# Patient Record
Sex: Female | Born: 1960 | Race: White | Hispanic: No | Marital: Married | Smoking: Former smoker
Health system: Southern US, Community
[De-identification: ages and names within clinical notes are randomized; demographics above are authoritative.]

## PROBLEM LIST (undated history)

## (undated) HISTORY — PX: KNEE SURGERY: SHX244

## (undated) HISTORY — PX: TUBAL LIGATION: SHX77

## (undated) HISTORY — PX: FOOT SURGERY: SHX648

---

## 1998-02-15 ENCOUNTER — Emergency Department (HOSPITAL_COMMUNITY): Admission: EM | Admit: 1998-02-15 | Discharge: 1998-02-15 | Payer: Self-pay | Admitting: Emergency Medicine

## 1998-07-26 ENCOUNTER — Encounter: Payer: Self-pay | Admitting: Emergency Medicine

## 1998-07-26 ENCOUNTER — Emergency Department (HOSPITAL_COMMUNITY): Admission: EM | Admit: 1998-07-26 | Discharge: 1998-07-26 | Payer: Self-pay | Admitting: Emergency Medicine

## 1998-10-10 ENCOUNTER — Encounter: Payer: Self-pay | Admitting: Orthopedic Surgery

## 1998-10-10 ENCOUNTER — Ambulatory Visit (HOSPITAL_COMMUNITY): Admission: RE | Admit: 1998-10-10 | Discharge: 1998-10-10 | Payer: Self-pay | Admitting: Orthopedic Surgery

## 1998-12-07 ENCOUNTER — Other Ambulatory Visit: Admission: RE | Admit: 1998-12-07 | Discharge: 1998-12-07 | Payer: Self-pay | Admitting: Obstetrics

## 1999-01-05 ENCOUNTER — Ambulatory Visit (HOSPITAL_COMMUNITY): Admission: RE | Admit: 1999-01-05 | Discharge: 1999-01-05 | Payer: Self-pay | Admitting: Obstetrics

## 1999-01-05 ENCOUNTER — Encounter: Payer: Self-pay | Admitting: Obstetrics

## 1999-03-24 ENCOUNTER — Ambulatory Visit (HOSPITAL_COMMUNITY): Admission: RE | Admit: 1999-03-24 | Discharge: 1999-03-24 | Payer: Self-pay | Admitting: *Deleted

## 2000-01-04 ENCOUNTER — Other Ambulatory Visit: Admission: RE | Admit: 2000-01-04 | Discharge: 2000-01-04 | Payer: Self-pay | Admitting: Obstetrics

## 2000-02-08 ENCOUNTER — Encounter: Payer: Self-pay | Admitting: Obstetrics

## 2000-02-08 ENCOUNTER — Ambulatory Visit (HOSPITAL_COMMUNITY): Admission: RE | Admit: 2000-02-08 | Discharge: 2000-02-08 | Payer: Self-pay | Admitting: Obstetrics

## 2001-03-12 ENCOUNTER — Encounter: Payer: Self-pay | Admitting: Obstetrics

## 2001-03-12 ENCOUNTER — Ambulatory Visit (HOSPITAL_COMMUNITY): Admission: RE | Admit: 2001-03-12 | Discharge: 2001-03-12 | Payer: Self-pay | Admitting: Obstetrics

## 2001-06-11 ENCOUNTER — Emergency Department (HOSPITAL_COMMUNITY): Admission: EM | Admit: 2001-06-11 | Discharge: 2001-06-11 | Payer: Self-pay | Admitting: Emergency Medicine

## 2001-06-13 ENCOUNTER — Emergency Department (HOSPITAL_COMMUNITY): Admission: EM | Admit: 2001-06-13 | Discharge: 2001-06-13 | Payer: Self-pay | Admitting: Emergency Medicine

## 2002-03-16 ENCOUNTER — Ambulatory Visit (HOSPITAL_COMMUNITY): Admission: RE | Admit: 2002-03-16 | Discharge: 2002-03-16 | Payer: Self-pay | Admitting: Obstetrics

## 2002-03-16 ENCOUNTER — Encounter: Payer: Self-pay | Admitting: Obstetrics

## 2003-05-24 ENCOUNTER — Ambulatory Visit (HOSPITAL_COMMUNITY): Admission: RE | Admit: 2003-05-24 | Discharge: 2003-05-24 | Payer: Self-pay | Admitting: Obstetrics

## 2004-06-14 ENCOUNTER — Ambulatory Visit (HOSPITAL_COMMUNITY): Admission: RE | Admit: 2004-06-14 | Discharge: 2004-06-14 | Payer: Self-pay | Admitting: Obstetrics

## 2004-09-09 ENCOUNTER — Emergency Department (HOSPITAL_COMMUNITY): Admission: EM | Admit: 2004-09-09 | Discharge: 2004-09-09 | Payer: Self-pay | Admitting: Emergency Medicine

## 2005-06-27 ENCOUNTER — Encounter: Admission: RE | Admit: 2005-06-27 | Discharge: 2005-06-27 | Payer: Self-pay | Admitting: Obstetrics

## 2005-08-02 ENCOUNTER — Encounter: Admission: RE | Admit: 2005-08-02 | Discharge: 2005-08-02 | Payer: Self-pay | Admitting: Obstetrics

## 2010-11-19 ENCOUNTER — Encounter: Payer: Self-pay | Admitting: Obstetrics

## 2011-04-10 ENCOUNTER — Emergency Department (HOSPITAL_COMMUNITY)
Admission: EM | Admit: 2011-04-10 | Discharge: 2011-04-10 | Disposition: A | Discharge status: Home / Self Care | Payer: Self-pay | Attending: Emergency Medicine | Admitting: Emergency Medicine

## 2011-04-10 DIAGNOSIS — F17200 Nicotine dependence, unspecified, uncomplicated: Secondary | ICD-10-CM | POA: Insufficient documentation

## 2011-04-10 DIAGNOSIS — IMO0001 Reserved for inherently not codable concepts without codable children: Secondary | ICD-10-CM | POA: Insufficient documentation

## 2011-04-10 DIAGNOSIS — K089 Disorder of teeth and supporting structures, unspecified: Secondary | ICD-10-CM | POA: Insufficient documentation

## 2011-04-10 DIAGNOSIS — K047 Periapical abscess without sinus: Secondary | ICD-10-CM | POA: Insufficient documentation

## 2013-01-08 ENCOUNTER — Other Ambulatory Visit (HOSPITAL_COMMUNITY)
Admission: RE | Admit: 2013-01-08 | Discharge: 2013-01-08 | Disposition: A | Discharge status: Home / Self Care | Payer: BC Managed Care – PPO | Source: Ambulatory Visit | Attending: Family Medicine | Admitting: Family Medicine

## 2013-01-08 ENCOUNTER — Other Ambulatory Visit: Payer: Self-pay | Admitting: Family Medicine

## 2013-01-08 DIAGNOSIS — Z124 Encounter for screening for malignant neoplasm of cervix: Secondary | ICD-10-CM | POA: Insufficient documentation

## 2013-01-08 DIAGNOSIS — Z1151 Encounter for screening for human papillomavirus (HPV): Secondary | ICD-10-CM | POA: Insufficient documentation

## 2013-04-16 ENCOUNTER — Other Ambulatory Visit: Payer: Self-pay

## 2013-04-16 DIAGNOSIS — Z1231 Encounter for screening mammogram for malignant neoplasm of breast: Secondary | ICD-10-CM

## 2013-04-22 ENCOUNTER — Ambulatory Visit
Admission: RE | Admit: 2013-04-22 | Discharge: 2013-04-22 | Disposition: A | Discharge status: Home / Self Care | Payer: BC Managed Care – PPO | Source: Ambulatory Visit

## 2013-04-22 DIAGNOSIS — Z1231 Encounter for screening mammogram for malignant neoplasm of breast: Secondary | ICD-10-CM

## 2014-03-16 ENCOUNTER — Other Ambulatory Visit: Payer: Self-pay

## 2014-03-16 DIAGNOSIS — Z1231 Encounter for screening mammogram for malignant neoplasm of breast: Secondary | ICD-10-CM

## 2014-03-29 ENCOUNTER — Other Ambulatory Visit: Payer: Self-pay | Admitting: Gastroenterology

## 2014-04-26 ENCOUNTER — Ambulatory Visit
Admission: RE | Admit: 2014-04-26 | Discharge: 2014-04-26 | Disposition: A | Discharge status: Home / Self Care | Payer: PRIVATE HEALTH INSURANCE | Source: Ambulatory Visit

## 2014-04-26 DIAGNOSIS — Z1231 Encounter for screening mammogram for malignant neoplasm of breast: Secondary | ICD-10-CM

## 2017-08-27 ENCOUNTER — Encounter: Payer: Self-pay | Admitting: Emergency Medicine

## 2017-08-27 ENCOUNTER — Emergency Department: Payer: PRIVATE HEALTH INSURANCE

## 2017-08-27 ENCOUNTER — Emergency Department
Admission: EM | Admit: 2017-08-27 | Discharge: 2017-08-27 | Disposition: A | Discharge status: Home / Self Care | Payer: PRIVATE HEALTH INSURANCE | Attending: Emergency Medicine | Admitting: Emergency Medicine

## 2017-08-27 DIAGNOSIS — R079 Chest pain, unspecified: Secondary | ICD-10-CM | POA: Insufficient documentation

## 2017-08-27 DIAGNOSIS — F17210 Nicotine dependence, cigarettes, uncomplicated: Secondary | ICD-10-CM | POA: Insufficient documentation

## 2017-08-27 LAB — CBC
HEMATOCRIT: 41.8 % (ref 35.0–47.0)
HEMOGLOBIN: 14.3 g/dL (ref 12.0–16.0)
MCH: 30.9 pg (ref 26.0–34.0)
MCHC: 34.1 g/dL (ref 32.0–36.0)
MCV: 90.5 fL (ref 80.0–100.0)
Platelets: 282 10*3/uL (ref 150–440)
RBC: 4.61 MIL/uL (ref 3.80–5.20)
RDW: 13.3 % (ref 11.5–14.5)
WBC: 8.4 10*3/uL (ref 3.6–11.0)

## 2017-08-27 LAB — BASIC METABOLIC PANEL
ANION GAP: 6 (ref 5–15)
BUN: 16 mg/dL (ref 6–20)
CO2: 28 mmol/L (ref 22–32)
Calcium: 8.9 mg/dL (ref 8.9–10.3)
Chloride: 105 mmol/L (ref 101–111)
Creatinine, Ser: 0.75 mg/dL (ref 0.44–1.00)
Glucose, Bld: 107 mg/dL — ABNORMAL HIGH (ref 65–99)
POTASSIUM: 4.6 mmol/L (ref 3.5–5.1)
SODIUM: 139 mmol/L (ref 135–145)

## 2017-08-27 LAB — TROPONIN I: Troponin I: <0.03 ng/mL (ref <0.03)

## 2017-08-27 MED ORDER — ASPIRIN EC 81 MG PO TBEC: 81.0000 mg | DELAYED_RELEASE_TABLET | Freq: Every day | ORAL | 0 refills | Status: AC

## 2017-08-27 MED ORDER — KETOROLAC TROMETHAMINE 30 MG/ML IJ SOLN
30.0000 mg | Freq: Once | INTRAMUSCULAR | Status: AC
  Administered 2017-08-27: 30 mg via INTRAVENOUS
  Filled 2017-08-27: qty 1

## 2017-08-27 MED ORDER — SODIUM CHLORIDE 0.9 % IV BOLUS (SEPSIS)
500.0000 mL | Freq: Once | INTRAVENOUS | Status: AC
  Administered 2017-08-27: 500 mL via INTRAVENOUS

## 2017-08-27 MED ORDER — BACITRACIN ZINC 500 UNIT/GM EX OINT
TOPICAL_OINTMENT | CUTANEOUS | Status: AC
  Filled 2017-08-27: qty 0.9

## 2017-08-27 NOTE — ED Notes (Signed)
Patient to Room 19, [facility] RN aware.

## 2017-08-27 NOTE — ED Provider Notes (Signed)
[facility] Emergency Department Provider Note   ____________________________________________   First MD Initiated Contact with Patient 08/27/17 1143     (approximate)  I have reviewed the triage vital signs and the nursing notes.   HISTORY  Chief Complaint Chest Pain    HPI Lindsay Mcconnell is a 56 y.o. female with a history of fibromyalgia as well as smoking who is presenting to the emergency department today with left-sided chest pain.  She says that she has had a pressure type pain which was worse on this past Friday as well as Sunday.  She says that she was at a baby shower with her multiple sisters which made her very stressed.  She thinks that this may have contributed to her chest pain.  However, the chest pain has continued as a constant pressure over the past 2 days.  It is associated with weakness.  There is radiation over the left side of the chest to the left thoracic back.  There is no nausea, vomiting or diaphoresis.  The patient has a family history of CAD but has never been diagnosed with cardiac disease herself.  She says that she is quitting smoking and is down to about 5-10 cigarettes/day.  Says that she also does heavy lifting on her job and pain worsens with movement.    History reviewed. No pertinent past medical history.  There are no active problems to display for this patient.   Past Surgical History:  Procedure Laterality Date  . FOOT SURGERY    . KNEE SURGERY    . TUBAL LIGATION      Prior to Admission medications   Not on File    Allergies Erythromycin; Sulfa antibiotics; and Penicillins  History reviewed. No pertinent family history.  Social History Social History  Substance Use Topics  . Smoking status: Current Every Day Smoker    Packs/day: 0.50    Types: Cigarettes  . Smokeless tobacco: Never Used  . Alcohol use 30.0 oz/week    50 Cans of beer per week    Review of Systems  Constitutional: No  fever/chills Eyes: No visual changes. ENT: No sore throat. Cardiovascular: as above Respiratory: Denies shortness of breath. Gastrointestinal: No abdominal pain.  No nausea, no vomiting.  No diarrhea.  No constipation. Genitourinary: Negative for dysuria. Musculoskeletal: Negative for back pain. Skin: Negative for rash. Neurological: Negative for headaches, focal weakness or numbness.   ____________________________________________   PHYSICAL EXAM:  VITAL SIGNS: ED Triage Vitals  Enc Vitals Group     BP 08/27/17 1128 120/73     Pulse Rate 08/27/17 1128 (!) 54     Resp 08/27/17 1128 18     Temp 08/27/17 1128 98.9 F (37.2 C)     Temp Source 08/27/17 1128 Oral     SpO2 08/27/17 1128 98 %     Weight 08/27/17 1128 165 lb (74.8 kg)     Height 08/27/17 1128 5\' 7"  (1.702 m)     Head Circumference --      Peak Flow --      Pain Score 08/27/17 1127 6     Pain Loc --      Pain Edu? --      Excl. in [place]? --     Constitutional: Alert and oriented. Well appearing and in no acute distress. Eyes: Conjunctivae are normal.  Head: Atraumatic. Nose: No congestion/rhinnorhea. Mouth/Throat: Mucous membranes are moist.  Neck: No stridor.   Cardiovascular: Normal rate, regular rhythm. Grossly  normal heart sounds.  Good peripheral circulation with equal bilateral radial as well as dorsalis pedis pulses.  Chest pain is reproducible to palpation over the left outer quadrant of the pectoralis major muscle. Respiratory: Normal respiratory effort.  No retractions. Lungs CTAB. Gastrointestinal: Soft and nontender. No distention.  Musculoskeletal: No lower extremity tenderness nor edema.  No joint effusions. Neurologic:  Normal speech and language. No gross focal neurologic deficits are appreciated. Skin:  Skin is warm, dry and intact. No rash noted. Psychiatric: Mood and affect are normal. Speech and behavior are normal.  ____________________________________________   LABS (all labs ordered are  listed, but only abnormal results are displayed)  Labs Reviewed  BASIC METABOLIC PANEL - Abnormal; Notable for the following:       Result Value   Glucose, Bld 107 (*)    All other components within normal limits  CBC  TROPONIN I   ____________________________________________  EKG  ED ECG REPORT I, Doran Stabler, the attending physician, personally viewed and interpreted this ECG.   Date: 08/27/2017  EKG Time: 1126  Rate: 51  Rhythm: sinus bradycardia  Axis: normal  Intervals:none  ST&T Change: No ST segment elevation or depression.  No abnormal T wave inversion.  ____________________________________________  RADIOLOGY  No acute finding ____________________________________________   PROCEDURES  Procedure(s) performed:   Procedures  Critical Care performed:   ____________________________________________   INITIAL IMPRESSION / ASSESSMENT AND PLAN / ED COURSE  Pertinent labs & imaging results that were available during my care of the patient were reviewed by me and considered in my medical decision making (see chart for details).  Differential diagnosis includes, but is not limited to, ACS, aortic dissection, pulmonary embolism, cardiac tamponade, pneumothorax, pneumonia, pericarditis/myocarditis, GI-related causes including esophagitis/gastritis, and musculoskeletal chest wall pain.    As part of my medical decision making, I reviewed the following data within the [facility] to review old ED visits because not available in epic.   Heart score of 2.   ----------------------------------------- 1:17 PM on 08/27/2017 -----------------------------------------  Patient's chest pain is greatly reduced after Toradol.  Very reassuring lab work as well as chest x-ray.  Patient to be referred to cardiology for outpatient follow-up within 72 hours.  I discussed the plan with the patient and she is understanding and willing to comply but will  be discharged at this time.  Patient knows to return for any worsening or concerning symptoms.  Likely chest wall pain.  ____________________________________________   FINAL CLINICAL IMPRESSION(S) / ED DIAGNOSES  Chest pain.    NEW MEDICATIONS STARTED DURING THIS VISIT:  New Prescriptions   No medications on file     Note:  This document was prepared using Dragon voice recognition software and may include unintentional dictation errors.    Orbie Pyo, MD 08/27/17 1318

## 2017-08-27 NOTE — ED Notes (Signed)
Patient's fiance brought to bedside.

## 2017-08-27 NOTE — ED Triage Notes (Signed)
Pt presents with chest pain since last Friday intermittently. She states that it was really bad on the weekend, and she went to her doctor today, who sent her here. Pt concerned that it may have been anxiety related, as there were some family gatherings over the weekend. Pain remains, but has changed, from sharp to pressure. Pt alert & oriented with NAD noted,.

## 2017-10-30 ENCOUNTER — Ambulatory Visit: Payer: Self-pay

## 2017-12-04 ENCOUNTER — Ambulatory Visit
Admission: RE | Admit: 2017-12-04 | Discharge: 2017-12-04 | Disposition: A | Discharge status: Home / Self Care | Payer: Self-pay | Source: Ambulatory Visit | Attending: Oncology | Admitting: Oncology

## 2017-12-04 ENCOUNTER — Ambulatory Visit: Payer: Self-pay | Attending: Oncology | Admitting: *Deleted

## 2017-12-04 VITALS — BP 117/78 | HR 59 | Temp 98.2°F | Ht 67.0 in | Wt 183.0 lb

## 2017-12-04 DIAGNOSIS — Z0000 Encounter for general adult medical examination without abnormal findings: Secondary | ICD-10-CM

## 2017-12-04 NOTE — Patient Instructions (Signed)
HPV Test The human papillomavirus (HPV) test is used to look for high-risk types of HPV infection. HPV is a group of about 100 viruses. Many of these viruses cause growths on, in, or around the genitals. Most HPV viruses cause infections that usually go away without treatment. However, HPV types 6, 11, 16, and 18 are considered high-risk types of HPV that can increase your risk of cancer of the cervix or anus if the infection is left untreated. An HPV test identifies the DNA (genetic) strands of the HPV infection, so it is also referred to as the HPV DNA test. Although HPV is found in both males and females, the HPV test is only used to screen for increased cancer risk in females:  With an abnormal Pap test.  After treatment of an abnormal Pap test.  Between the ages of 30 and 65.  After treatment of a high-risk HPV infection.  The HPV test may be done at the same time as a pelvic exam and Pap test in females over the age of 30. Both the HPV test and Pap test require a sample of cells from the cervix. How do I prepare for this test?  Do not douche or take a bath for 24-48 hours before the test or as directed by your health care provider.  Do not have sex for 24-48 hours before the test or as directed by your health care provider.  You may be asked to reschedule the test if you are menstruating.  You will be asked to urinate before the test. What do the results mean? It is your responsibility to obtain your test results. Ask the lab or department performing the test when and how you will get your results. Talk with your health care provider if you have any questions about your results. Your result will be negative or positive. Meaning of Negative Test Results A negative HPV test result means that no HPV was found, and it is very likely that you do not have HPV. Meaning of Positive Test Results A positive HPV test result indicates that you have HPV.  If your test result shows the presence  of any high-risk HPV strains, you may have an increased risk of developing cancer of the cervix or anus if the infection is left untreated.  If any low-risk HPV strains are found, you are not likely to have an increased risk of cancer.  Discuss your test results with your health care provider. He or she will use the results to make a diagnosis and determine a treatment plan that is right for you. Talk with your health care provider to discuss your results, treatment options, and if necessary, the need for more tests. Talk with your health care provider if you have any questions about your results. This information is not intended to replace advice given to you by your health care provider. Make sure you discuss any questions you have with your health care provider. Document Released: 11/09/2004 Document Revised: 06/20/2016 Document Reviewed: 03/02/2014 Elsevier Interactive Patient Education  2018 Elsevier Inc.   Gave patient hand-out, Women Staying Healthy, Active and Well from [facility], with education on breast health, pap smears, heart and colon health.  

## 2017-12-04 NOTE — Progress Notes (Signed)
Subjective:     Patient ID: Lindsay Mcconnell, female   DOB: 03-08-1961, 57 y.o.   MRN: [id number]  HPI   Review of Systems     Objective:   Physical Exam  Pulmonary/Chest: Right breast exhibits no inverted nipple, no mass, no nipple discharge, no skin change and no tenderness. Left breast exhibits no inverted nipple, no mass, no nipple discharge, no skin change and no tenderness. Breasts are symmetrical.  Abdominal: There is no splenomegaly or hepatomegaly.  Genitourinary: No labial fusion. There is no rash, tenderness, lesion or injury on the right labia. There is no rash, tenderness, lesion or injury on the left labia. Cervix exhibits no motion tenderness and no discharge. Right adnexum displays no mass, no tenderness and no fullness. Left adnexum displays no mass, no tenderness and no fullness. No erythema, tenderness or bleeding in the vagina. No foreign body in the vagina. No signs of injury around the vagina. No vaginal discharge found.       Assessment:     57 year old White female presents to [facility] for clinical breast exam, pap and mammogram.  Clinical breast exam unremarkable.  Taught self breast awareness.  Last pap on 01/08/13 was negative / negative.  Specimen collected for pap smear without difficulty.  Patient has been screened for eligibility.  She does not have any insurance, Medicare or Medicaid.  She also meets financial eligibility.  Hand-out given on the Affordable Care Act.    Plan:     Screening mammogram ordered.  Specimen for pap sent to the lab.  Will follow-up per [facility] protocol.

## 2017-12-06 LAB — PAP LB AND HPV HIGH-RISK
HPV, HIGH-RISK: NEGATIVE
PAP SMEAR COMMENT: 0

## 2017-12-12 ENCOUNTER — Encounter: Payer: Self-pay | Admitting: *Deleted

## 2017-12-12 NOTE — Progress Notes (Signed)
Letter mailed from the [facility] to inform patient of her normal mammogram results.  Patient is to follow-up with annual screening in one year.  Letter mailed to inform patient of her normal pap results.  Next pap due in 5 years.  [facility] to [place].

## 2020-01-12 ENCOUNTER — Ambulatory Visit: Payer: Self-pay | Attending: Internal Medicine

## 2020-01-12 ENCOUNTER — Other Ambulatory Visit: Payer: Self-pay

## 2020-01-12 DIAGNOSIS — Z23 Encounter for immunization: Secondary | ICD-10-CM

## 2020-01-12 NOTE — Progress Notes (Signed)
   Covid-19 Vaccination Clinic  Name:  Lindsay Mcconnell    MRN: [id number] DOB: [birth year]  01/12/2020  [name] was observed post Covid-19 immunization for 15 minutes without incident. She was provided with Vaccine Information Sheet and instruction to access the V-Safe system.   [name] was instructed to call 911 with any severe reactions post vaccine: Marland Kitchen Difficulty breathing  . Swelling of face and throat  . A fast heartbeat  . A bad rash all over body  . Dizziness and weakness   Immunizations Administered    Name Date Dose VIS Date Route   Pfizer COVID-19 Vaccine 01/12/2020  8:37 AM 0.3 mL 10/09/2019 Intramuscular   Manufacturer: [place]   Lot: [id number]   [place]: [id number]

## 2020-02-02 ENCOUNTER — Other Ambulatory Visit: Payer: Self-pay

## 2020-02-02 ENCOUNTER — Ambulatory Visit: Payer: Medicaid Other | Attending: Internal Medicine

## 2020-02-02 DIAGNOSIS — Z23 Encounter for immunization: Secondary | ICD-10-CM

## 2020-02-02 NOTE — Progress Notes (Signed)
   Covid-19 Vaccination Clinic  Name:  BELMA DADDARIO    MRN: [id number] DOB: March 29, 1961  02/02/2020  [name] was observed post Covid-19 immunization for 15 minutes without incident. She was provided with Vaccine Information Sheet and instruction to access the V-Safe system.   [name] was instructed to call 911 with any severe reactions post vaccine: Marland Kitchen Difficulty breathing  . Swelling of face and throat  . A fast heartbeat  . A bad rash all over body  . Dizziness and weakness   Immunizations Administered    Name Date Dose VIS Date Route   Pfizer COVID-19 Vaccine 02/02/2020  2:45 PM 0.3 mL 10/09/2019 Intramuscular   Manufacturer: [place]   Lot: [id number]   [place]: [id number]

## 2020-05-11 ENCOUNTER — Other Ambulatory Visit: Payer: Self-pay

## 2020-05-11 ENCOUNTER — Emergency Department
Admission: EM | Admit: 2020-05-11 | Discharge: 2020-05-11 | Disposition: A | Discharge status: Home / Self Care | Payer: Medicaid Other | Attending: Emergency Medicine | Admitting: Emergency Medicine

## 2020-05-11 ENCOUNTER — Emergency Department: Payer: Medicaid Other

## 2020-05-11 DIAGNOSIS — M25571 Pain in right ankle and joints of right foot: Secondary | ICD-10-CM | POA: Insufficient documentation

## 2020-05-11 DIAGNOSIS — Y9389 Activity, other specified: Secondary | ICD-10-CM | POA: Insufficient documentation

## 2020-05-11 DIAGNOSIS — Y999 Unspecified external cause status: Secondary | ICD-10-CM | POA: Insufficient documentation

## 2020-05-11 DIAGNOSIS — M542 Cervicalgia: Secondary | ICD-10-CM | POA: Insufficient documentation

## 2020-05-11 DIAGNOSIS — Y92410 Unspecified street and highway as the place of occurrence of the external cause: Secondary | ICD-10-CM | POA: Insufficient documentation

## 2020-05-11 DIAGNOSIS — F17210 Nicotine dependence, cigarettes, uncomplicated: Secondary | ICD-10-CM | POA: Insufficient documentation

## 2020-05-11 DIAGNOSIS — R519 Headache, unspecified: Secondary | ICD-10-CM | POA: Insufficient documentation

## 2020-05-11 MED ORDER — HYDROCODONE-ACETAMINOPHEN 5-325 MG PO TABS
1.0000 | ORAL_TABLET | Freq: Once | ORAL | Status: AC
  Administered 2020-05-11: 1 via ORAL
  Filled 2020-05-11: qty 1

## 2020-05-11 MED ORDER — MELOXICAM 15 MG PO TABS: 15.0000 mg | ORAL_TABLET | Freq: Every day | ORAL | 1 refills | Status: AC

## 2020-05-11 MED ORDER — ONDANSETRON 4 MG PO TBDP
4.0000 mg | ORAL_TABLET | Freq: Once | ORAL | Status: AC
  Administered 2020-05-11: 4 mg via ORAL
  Filled 2020-05-11: qty 1

## 2020-05-11 MED ORDER — METHOCARBAMOL 500 MG PO TABS: 500.0000 mg | ORAL_TABLET | Freq: Three times a day (TID) | ORAL | 0 refills | Status: AC | PRN
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
## 2020-05-11 NOTE — Discharge Instructions (Signed)
Take meloxicam for pain and inflammation.
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
## 2022-08-01 ENCOUNTER — Encounter: Payer: Self-pay | Admitting: Internal Medicine

## 2022-08-01 ENCOUNTER — Ambulatory Visit (INDEPENDENT_AMBULATORY_CARE_PROVIDER_SITE_OTHER): Payer: 59 | Admitting: Internal Medicine

## 2022-08-01 VITALS — BP 118/78 | HR 63 | Temp 97.7°F | Resp 16 | Ht 66.5 in | Wt 175.7 lb

## 2022-08-01 DIAGNOSIS — Z13228 Encounter for screening for other metabolic disorders: Secondary | ICD-10-CM

## 2022-08-01 DIAGNOSIS — Z1211 Encounter for screening for malignant neoplasm of colon: Secondary | ICD-10-CM

## 2022-08-01 DIAGNOSIS — Z130 Encounter for screening for diseases of the blood and blood-forming organs and certain disorders involving the immune mechanism: Secondary | ICD-10-CM | POA: Diagnosis not present

## 2022-08-01 DIAGNOSIS — Z13220 Encounter for screening for lipoid disorders: Secondary | ICD-10-CM

## 2022-08-01 DIAGNOSIS — Z1231 Encounter for screening mammogram for malignant neoplasm of breast: Secondary | ICD-10-CM

## 2022-08-01 DIAGNOSIS — Z114 Encounter for screening for human immunodeficiency virus [HIV]: Secondary | ICD-10-CM

## 2022-08-01 DIAGNOSIS — Z1159 Encounter for screening for other viral diseases: Secondary | ICD-10-CM

## 2022-08-01 DIAGNOSIS — Z0000 Encounter for general adult medical examination without abnormal findings: Secondary | ICD-10-CM

## 2022-08-01 DIAGNOSIS — Z23 Encounter for immunization: Secondary | ICD-10-CM

## 2022-08-01 NOTE — Progress Notes (Signed)
New Patient Office Visit  Subjective    Patient ID: Lindsay Mcconnell, female    DOB: September 01, 1961  Age: 61 y.o. MRN: [id number]  CC:  Chief Complaint  Patient presents with   Establish Care    HPI Lindsay Mcconnell presents to establish care. She does have have any chronic medical conditions and takes no daily medications. She will take Ibuprofen occasionally for aches and pains. 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 She is a former smoker and quit 5 years ago however she does drink a total of about 30 beers weekly.   Health Maintenance: -Blood work due -Mammogram due -Colon cancer screening due -Tdap and flu due  -Pap due  No outpatient encounter medications on file as of 08/01/2022.   No facility-administered encounter medications on file as of 08/01/2022.    History reviewed. No pertinent past medical history.  Past Surgical History:  Procedure Laterality Date   FOOT SURGERY     KNEE SURGERY     TUBAL LIGATION      Family History  Problem Relation Age of Onset   Hypertension Mother    Breast cancer Mother 49   COPD Father    Heart disease Father    Hyperlipidemia Sister    Hypertension Sister    Diabetes Sister    Breast cancer Paternal Grandmother     Social History   Socioeconomic History   Marital status: Divorced    Spouse name: Not on file   Number of children: Not on file   Years of education: Not on file   Highest education level: Not on file  Occupational History   Not on file  Tobacco Use   Smoking status: Former    Packs/day: 0.50    Types: Cigarettes    Quit date: 09/07/2009    Years since quitting: 12.9   Smokeless tobacco: Never  Vaping Use   Vaping Use: Never used  Substance and Sexual Activity   Alcohol use: Yes    Alcohol/week: 35.0 standard drinks of alcohol    Types: 35 Cans of beer per week   Drug use: No   Sexual activity: Not Currently  Other Topics Concern   Not on file  Social History Narrative   Not on file    Social Determinants of Health   Financial Resource Strain: Not on file  Food Insecurity: Not on file  Transportation Needs: Not on file  Physical Activity: Not on file  Stress: Not on file  Social Connections: Not on file  Intimate Partner Violence: Not on file    Review of Systems  Constitutional:  Negative for chills and fever.  Eyes:  Negative for blurred vision.  Respiratory:  Negative for shortness of breath.   Cardiovascular:  Negative for chest pain.  Gastrointestinal:  Negative for abdominal pain.        Objective    BP 118/78   Pulse 63   Temp 97.7 F (36.5 C)   Resp 16   Ht 5' 6.5" (1.689 m)   Wt 175 lb 11.2 oz (79.7 kg)   SpO2 97%   BMI 27.93 kg/m   Physical Exam Constitutional:      Appearance: Normal appearance.  HENT:     Head: Normocephalic and atraumatic.     Nose: Nose normal.     Mouth/Throat:     Mouth: Mucous membranes are moist.     Comments: PND present Eyes:     Extraocular Movements: Extraocular movements intact.  Conjunctiva/sclera: Conjunctivae normal.     Pupils: Pupils are equal, round, and reactive to light.  Cardiovascular:     Rate and Rhythm: Normal rate and regular rhythm.  Pulmonary:     Effort: Pulmonary effort is normal.     Breath sounds: Normal breath sounds.  Musculoskeletal:     Right lower leg: No edema.     Left lower leg: No edema.  Lymphadenopathy:     Cervical: No cervical adenopathy.  Skin:    General: Skin is warm and dry.  Neurological:     General: No focal deficit present.     Mental Status: She is alert. Mental status is at baseline.  Psychiatric:        Mood and Affect: Mood normal.        Behavior: Behavior normal.         Assessment & Plan:   1. Encounter for medical examination to establish care/Encounter for screening for metabolic disorder/Screening for deficiency anemia/Lipid screening/Encounter for hepatitis C screening test for low risk patient/Screening for HIV without  presence of risk factors: Screening labs due today.   - COMPLETE METABOLIC PANEL WITH GFR - CBC w/Diff/Platelet - Lipid Profile - Hepatitis C Antibody - HIV antibody (with reflex)  2. Encounter for screening mammogram for malignant neoplasm of breast: Mammogram ordered.   - MM 3D SCREEN BREAST BILATERAL; Future  3. Screening for colon cancer: Referral to GI for colon cancer screening placed today.  - Ambulatory referral to Gastroenterology  4. Need for Tdap vaccination: Tdap vaccine administered today.   - Tdap vaccine greater than or equal to 7yo IM  5. Need for influenza vaccination: Flu vaccine administered today as well.   - Flu Vaccine QUAD 6+ mos PF IM (Fluarix Quad PF)  Return in about 4 weeks (around 08/29/2022) for CPE w/Pap.   Teodora Medici, DO

## 2022-08-01 NOTE — Patient Instructions (Addendum)
It was great seeing you today!  Plan discussed at today's visit: -Blood work ordered today, results will be uploaded to [facility].  -Please call number on the card to schedule mammogram -Referral to GI placed to schedule colonoscopy -Tdap and flu vaccines given today  Follow up in: 1 month for Pap  Take care and let us know if you have any questions or concerns prior to your next visit.  Dr. Rosana Berger

## 2022-08-02 ENCOUNTER — Other Ambulatory Visit: Payer: Self-pay

## 2022-08-02 ENCOUNTER — Telehealth: Payer: Self-pay

## 2022-08-02 DIAGNOSIS — Z1211 Encounter for screening for malignant neoplasm of colon: Secondary | ICD-10-CM

## 2022-08-02 LAB — HEPATITIS C ANTIBODY: Hepatitis C Ab: NONREACTIVE

## 2022-08-02 LAB — CBC WITH DIFFERENTIAL/PLATELET
Absolute Monocytes: 427 cells/uL (ref 200–950)
Basophils Absolute: 61 cells/uL (ref 0–200)
Basophils Relative: 1 %
Eosinophils Absolute: 238 cells/uL (ref 15–500)
Eosinophils Relative: 3.9 %
HCT: 41.5 % (ref 35.0–45.0)
Hemoglobin: 14.2 g/dL (ref 11.7–15.5)
Lymphs Abs: 2202 cells/uL (ref 850–3900)
MCH: 30.2 pg (ref 27.0–33.0)
MCHC: 34.2 g/dL (ref 32.0–36.0)
MCV: 88.3 fL (ref 80.0–100.0)
MPV: 10.2 fL (ref 7.5–12.5)
Monocytes Relative: 7 %
Neutro Abs: 3172 cells/uL (ref 1500–7800)
Neutrophils Relative %: 52 %
Platelets: 279 10*3/uL (ref 140–400)
RBC: 4.7 10*6/uL (ref 3.80–5.10)
RDW: 12.8 % (ref 11.0–15.0)
Total Lymphocyte: 36.1 %
WBC: 6.1 10*3/uL (ref 3.8–10.8)

## 2022-08-02 LAB — COMPLETE METABOLIC PANEL WITH GFR
AG Ratio: 1.8 (calc) (ref 1.0–2.5)
ALT: 15 U/L (ref 6–29)
AST: 18 U/L (ref 10–35)
Albumin: 4.5 g/dL (ref 3.6–5.1)
Alkaline phosphatase (APISO): 58 U/L (ref 37–153)
BUN: 21 mg/dL (ref 7–25)
CO2: 26 mmol/L (ref 20–32)
Calcium: 9.1 mg/dL (ref 8.6–10.4)
Chloride: 104 mmol/L (ref 98–110)
Creat: 0.86 mg/dL (ref 0.50–1.03)
Globulin: 2.5 g/dL (calc) (ref 1.9–3.7)
Glucose, Bld: 99 mg/dL (ref 65–99)
Potassium: 4.4 mmol/L (ref 3.5–5.3)
Sodium: 140 mmol/L (ref 135–146)
Total Bilirubin: 0.4 mg/dL (ref 0.2–1.2)
Total Protein: 7 g/dL (ref 6.1–8.1)
eGFR: 78 mL/min/{1.73_m2} (ref >=60)

## 2022-08-02 LAB — LIPID PANEL
Cholesterol: 240 mg/dL — ABNORMAL HIGH (ref <200)
HDL: 65 mg/dL (ref >=50)
LDL Cholesterol (Calc): 149 mg/dL (calc) — ABNORMAL HIGH
Non-HDL Cholesterol (Calc): 175 mg/dL (calc) — ABNORMAL HIGH (ref <130)
Total CHOL/HDL Ratio: 3.7 (calc) (ref <5.0)
Triglycerides: 132 mg/dL (ref <150)

## 2022-08-02 LAB — HIV ANTIBODY (ROUTINE TESTING W REFLEX): HIV 1&2 Ab, 4th Generation: NONREACTIVE

## 2022-08-02 MED ORDER — NA SULFATE-K SULFATE-MG SULF 17.5-3.13-1.6 GM/177ML PO SOLN: 1.0000 | Freq: Once | ORAL | 0 refills | Status: AC

## 2022-08-02 NOTE — Telephone Encounter (Signed)
Gastroenterology Pre-Procedure Review  Request Date: 08/16/22 Requesting Physician: Dr. Marius Ditch  PATIENT REVIEW QUESTIONS: The patient responded to the following health history questions as indicated:    1. Are you having any GI issues?  Patient states that she has had 3 colonoscopys in [place] and has diverticulitis  last colonoscopy 10 years ago 2. Do you have a personal history of Polyps?  Doesn't recall  having any polyps 3. Do you have a family history of Colon Cancer or Polyps? no 4. Diabetes Mellitus? no 5. Joint replacements in the past 12 months?no 6. Major health problems in the past 3 months?no 7. Any artificial heart valves, MVP, or defibrillator?no    MEDICATIONS & ALLERGIES:    Patient reports the following regarding taking any anticoagulation/antiplatelet therapy:   Plavix, Coumadin, Eliquis, Xarelto, Lovenox, Pradaxa, Brilinta, or Effient? no Aspirin? no  Patient confirms/reports the following medications:  No current outpatient medications on file.   No current facility-administered medications for this visit.    Patient confirms/reports the following allergies:  Allergies  Allergen Reactions   Erythromycin     dizziness   Sulfa Antibiotics Nausea And Vomiting   Penicillins Rash    Has patient had a PCN reaction causing immediate rash, facial/tongue/throat swelling, SOB or lightheadedness with hypotension: No Has patient had a PCN reaction causing severe rash involving mucus membranes or skin necrosis: No Has patient had a PCN reaction that required hospitalization: No Has patient had a PCN reaction occurring within the last 10 years: No If all of the above answers are "NO", then may proceed with Cephalosporin use.     No orders of the defined types were placed in this encounter.   AUTHORIZATION INFORMATION Primary Insurance: 1D#: Group #:  Secondary Insurance: 1D#: Group #:  SCHEDULE INFORMATION: Date:  Time: Location:

## 2022-08-08 ENCOUNTER — Encounter: Payer: Self-pay | Admitting: Gastroenterology

## 2022-08-08 ENCOUNTER — Other Ambulatory Visit: Payer: Self-pay

## 2022-08-16 ENCOUNTER — Ambulatory Visit: Payer: 59 | Admitting: Anesthesiology

## 2022-08-16 ENCOUNTER — Ambulatory Visit
Admission: RE | Admit: 2022-08-16 | Discharge: 2022-08-16 | Disposition: A | Discharge status: Home / Self Care | Payer: 59 | Attending: Gastroenterology | Admitting: Gastroenterology

## 2022-08-16 ENCOUNTER — Encounter: Payer: Self-pay | Admitting: Gastroenterology

## 2022-08-16 ENCOUNTER — Other Ambulatory Visit: Payer: Self-pay

## 2022-08-16 ENCOUNTER — Encounter
Admission: RE | Disposition: A | Discharge status: Home / Self Care | Payer: Self-pay | Source: Home / Self Care | Attending: Gastroenterology

## 2022-08-16 DIAGNOSIS — K5730 Diverticulosis of large intestine without perforation or abscess without bleeding: Secondary | ICD-10-CM | POA: Diagnosis not present

## 2022-08-16 DIAGNOSIS — K635 Polyp of colon: Secondary | ICD-10-CM | POA: Diagnosis not present

## 2022-08-16 DIAGNOSIS — D120 Benign neoplasm of cecum: Secondary | ICD-10-CM | POA: Insufficient documentation

## 2022-08-16 DIAGNOSIS — K219 Gastro-esophageal reflux disease without esophagitis: Secondary | ICD-10-CM | POA: Diagnosis not present

## 2022-08-16 DIAGNOSIS — K644 Residual hemorrhoidal skin tags: Secondary | ICD-10-CM | POA: Insufficient documentation

## 2022-08-16 DIAGNOSIS — D123 Benign neoplasm of transverse colon: Secondary | ICD-10-CM | POA: Insufficient documentation

## 2022-08-16 DIAGNOSIS — Z1211 Encounter for screening for malignant neoplasm of colon: Secondary | ICD-10-CM | POA: Diagnosis not present

## 2022-08-16 DIAGNOSIS — Z87891 Personal history of nicotine dependence: Secondary | ICD-10-CM | POA: Insufficient documentation

## 2022-08-16 HISTORY — PX: COLONOSCOPY WITH PROPOFOL: SHX5780

## 2022-08-16 HISTORY — PX: POLYPECTOMY: SHX5525

## 2022-08-16 SURGERY — COLONOSCOPY WITH PROPOFOL
Anesthesia: General | Site: Rectum

## 2022-08-16 MED ORDER — PROPOFOL 10 MG/ML IV BOLUS
INTRAVENOUS | Status: DC | PRN
  Administered 2022-08-16: 25 mg via INTRAVENOUS
  Administered 2022-08-16: 50 mg via INTRAVENOUS
  Administered 2022-08-16 (×2): 25 mg via INTRAVENOUS
  Administered 2022-08-16: 100 mg via INTRAVENOUS

## 2022-08-16 MED ORDER — LACTATED RINGERS IV SOLN: INTRAVENOUS | Status: DC

## 2022-08-16 MED ORDER — STERILE WATER FOR IRRIGATION IR SOLN
Status: DC | PRN
  Administered 2022-08-16: .05 mL

## 2022-08-16 MED ORDER — STERILE WATER FOR IRRIGATION IR SOLN
Status: DC | PRN
  Administered 2022-08-16: 250 mL

## 2022-08-16 MED ORDER — SODIUM CHLORIDE 0.9 % IV SOLN: INTRAVENOUS | Status: DC

## 2022-08-16 MED ORDER — GLYCOPYRROLATE 0.2 MG/ML IJ SOLN
INTRAMUSCULAR | Status: DC | PRN
  Administered 2022-08-16: .2 mg via INTRAVENOUS

## 2022-08-16 SURGICAL SUPPLY — 9 items
FORCEPS BIOP RAD 4 LRG CAP 4 (CUTTING FORCEPS) IMPLANT
GOWN CVR UNV OPN BCK APRN NK (MISCELLANEOUS) ×4 IMPLANT
GOWN ISOL THUMB LOOP REG UNIV (MISCELLANEOUS) ×4
KIT PRC NS LF DISP ENDO (KITS) ×2 IMPLANT
KIT PROCEDURE OLYMPUS (KITS) ×2
MANIFOLD NEPTUNE II (INSTRUMENTS) ×2 IMPLANT
SNARE COLD EXACTO (MISCELLANEOUS) IMPLANT
TRAP ETRAP POLY (MISCELLANEOUS) IMPLANT
WATER STERILE IRR 250ML POUR (IV SOLUTION) ×2 IMPLANT

## 2022-08-16 NOTE — Anesthesia Preprocedure Evaluation (Signed)
Anesthesia Evaluation  Patient identified by MRN, date of birth, ID band Patient awake    Reviewed: Allergy & Precautions, H&P , NPO status , Patient's Chart, lab work & pertinent test results, reviewed documented beta blocker date and time   History of Anesthesia Complications Negative for: history of anesthetic complications  Airway Mallampati: I  TM Distance: >3 FB Neck ROM: full    Dental  (+) Dental Advidsory Given, Teeth Intact, Caps, Missing   Pulmonary neg pulmonary ROS, former smoker,    Pulmonary exam normal breath sounds clear to auscultation       Cardiovascular Exercise Tolerance: Good negative cardio ROS Normal cardiovascular exam Rhythm:regular Rate:Normal     Neuro/Psych negative neurological ROS  negative psych ROS   GI/Hepatic Neg liver ROS, GERD  ,  Endo/Other  negative endocrine ROS  Renal/GU negative Renal ROS  negative genitourinary   Musculoskeletal   Abdominal   Peds  Hematology negative hematology ROS (+)   Anesthesia Other Findings History reviewed. No pertinent past medical history.   Reproductive/Obstetrics negative OB ROS                            Anesthesia Physical Anesthesia Plan  ASA: 2  Anesthesia Plan: General   Post-op Pain Management:    Induction: Intravenous  PONV Risk Score and Plan: 3 and Propofol infusion and TIVA  Airway Management Planned: Natural Airway and Nasal Cannula  Additional Equipment:   Intra-op Plan:   Post-operative Plan:   Informed Consent: I have reviewed the patients History and Physical, chart, labs and discussed the procedure including the risks, benefits and alternatives for the proposed anesthesia with the patient or authorized representative who has indicated his/her understanding and acceptance.     Dental Advisory Given  Plan Discussed with: Anesthesiologist, CRNA and Surgeon  Anesthesia Plan  Comments:         Anesthesia Quick Evaluation

## 2022-08-16 NOTE — Anesthesia Postprocedure Evaluation (Addendum)
Anesthesia Post Note  Patient: Lindsay Mcconnell  Procedure(s) Performed: COLONOSCOPY WITH PROPOFOL (Rectum) POLYPECTOMY (Rectum)  Patient location during evaluation: PACU Anesthesia Type: General Level of consciousness: awake and alert Pain management: pain level controlled Vital Signs Assessment: post-procedure vital signs reviewed and stable Respiratory status: spontaneous breathing, nonlabored ventilation, respiratory function stable and patient connected to nasal cannula oxygen Cardiovascular status: blood pressure returned to baseline and stable Postop Assessment: no apparent nausea or vomiting Anesthetic complications: no   No notable events documented.   Last Vitals:  Vitals:   08/16/22 1100 08/16/22 1112  BP: 114/64 117/78  Pulse: 75 76  Resp: 16 17  Temp: 36.6 C   SpO2: 96% 95%    Last Pain:  Vitals:   08/16/22 1112  TempSrc:   PainSc: 0-No pain                 Martha Clan

## 2022-08-16 NOTE — Transfer of Care (Signed)
Immediate Anesthesia Transfer of Care Note  Patient: Lindsay Mcconnell  Procedure(s) Performed: COLONOSCOPY WITH PROPOFOL (Rectum) POLYPECTOMY (Rectum)  Patient Location: PACU  Anesthesia Type: MAC  Level of Consciousness: awake, alert  and patient cooperative  Airway and Oxygen Therapy: Patient Spontanous Breathing and Patient connected to supplemental oxygen  Post-op Assessment: Post-op Vital signs reviewed, Patient's Cardiovascular Status Stable, Respiratory Function Stable, Patent Airway and No signs of Nausea or vomiting  Post-op Vital Signs: Reviewed and stable  Complications: No notable events documented.

## 2022-08-16 NOTE — Op Note (Signed)
[facility] Patient Name: Lindsay Mcconnell Procedure Date: 08/16/2022 10:26 AM MRN: [id number] Account #: [id number] Date of Birth: 03-01-1961 Admit Type: Outpatient Age: 61 Room: [facility] OR ROOM 01 Gender: Female Note Status: Finalized Instrument Name: [id number] Procedure:             Colonoscopy Indications:           Screening for colorectal malignant neoplasm Providers:             Lin Landsman MD, MD Medicines:             General Anesthesia Complications:         No immediate complications. Estimated blood loss: None. Procedure:             Pre-Anesthesia Assessment:                        - Prior to the procedure, a History and Physical was                         performed, and patient medications and allergies were                         reviewed. The patient is competent. The risks and                         benefits of the procedure and the sedation options and                         risks were discussed with the patient. All questions                         were answered and informed consent was obtained.                         Patient identification and proposed procedure were                         verified by the physician, the nurse, the                         anesthesiologist, the anesthetist and the technician                         in the pre-procedure area in the procedure room in the                         endoscopy suite. Mental Status Examination: alert and                         oriented. Airway Examination: normal oropharyngeal                         airway and neck mobility. Respiratory Examination:                         clear to auscultation. CV Examination: normal.                         Prophylactic Antibiotics: The patient  does not require                         prophylactic antibiotics. Prior Anticoagulants: The                         patient has taken no previous anticoagulant or                          antiplatelet agents. ASA Grade Assessment: II - A                         patient with mild systemic disease. After reviewing                         the risks and benefits, the patient was deemed in                         satisfactory condition to undergo the procedure. The                         anesthesia plan was to use general anesthesia.                         Immediately prior to administration of medications,                         the patient was re-assessed for adequacy to receive                         sedatives. The heart rate, respiratory rate, oxygen                         saturations, blood pressure, adequacy of pulmonary                         ventilation, and response to care were monitored                         throughout the procedure. The physical status of the                         patient was re-assessed after the procedure.                        After obtaining informed consent, the colonoscope was                         passed under direct vision. Throughout the procedure,                         the patient's blood pressure, pulse, and oxygen                         saturations were monitored continuously. The                         Colonoscope was introduced through the anus and  advanced to the the cecum, identified by appendiceal                         orifice and ileocecal valve. The colonoscopy was                         performed without difficulty. The patient tolerated                         the procedure well. The quality of the bowel                         preparation was evaluated using the BBPS [facility] Bowel                         Preparation Scale) with scores of: Right Colon = 3,                         Transverse Colon = 3 and Left Colon = 3 (entire mucosa                         seen well with no residual staining, small fragments                         of stool or opaque liquid). The total BBPS score                          equals 9. Findings:      The perianal and digital rectal examinations were normal. Pertinent       negatives include normal sphincter tone and no palpable rectal lesions.      A diminutive polyp was found in the cecum. The polyp was sessile. The       polyp was removed with a cold biopsy forceps. Resection and retrieval       were complete.      A 5 mm polyp was found in the transverse colon. The polyp was sessile.       The polyp was removed with a cold snare. Resection and retrieval were       complete. Estimated blood loss: none.      Non-bleeding external hemorrhoids were found during retroflexion. The       hemorrhoids were medium-sized.      A few diverticula were found in the sigmoid colon. Impression:            - One diminutive polyp in the cecum, removed with a                         cold biopsy forceps. Resected and retrieved.                        - One 5 mm polyp in the transverse colon, removed with                         a cold snare. Resected and retrieved.                        - Non-bleeding external hemorrhoids.                        -  Diverticulosis in the sigmoid colon. Recommendation:        - Discharge patient to home (with escort).                        - Resume previous diet today.                        - Continue present medications.                        - Await pathology results.                        - Repeat colonoscopy in 7-10 years for surveillance                         based on pathology results. Procedure Code(s):     --- Professional ---                        [phone], Colonoscopy, flexible; with removal of                         tumor(s), polyp(s), or other lesion(s) by snare                         technique                        [id number], 11, Colonoscopy, flexible; with biopsy, single                         or multiple Diagnosis Code(s):     --- Professional ---                        Z12.11, Encounter for screening  for malignant neoplasm                         of colon                        K63.5, Polyp of colon                        K64.4, Residual hemorrhoidal skin tags                        K57.30, Diverticulosis of large intestine without                         perforation or abscess without bleeding CPT copyright 2019 [employer]. All rights reserved. The codes documented in this report are preliminary and upon coder review may  be revised to meet current compliance requirements. Dr. Ulyess Mort Lin Landsman MD, MD 08/16/2022 10:56:29 AM This report has been signed electronically. Number of Addenda: 0 Note Initiated On: 08/16/2022 10:26 AM Scope Withdrawal Time: 0 hours 10 minutes 29 seconds  Total Procedure Duration: 0 hours 15 minutes 3 seconds  Estimated Blood Loss:  Estimated blood loss: none.      [facility]

## 2022-08-16 NOTE — H&P (Signed)
  Lindsay Darby, MD [address]  [place]  [place], [place] [address]  Main: [phone]  Fax: [phone] Pager: [phone]  Primary Care Physician:  Teodora Medici, DO Primary Gastroenterologist:  Dr. Cephas Mcconnell  Pre-Procedure History & Physical: HPI:  Lindsay Mcconnell is a 61 y.o. female is here for an colonoscopy.   History reviewed. No pertinent past medical history.  Past Surgical History:  Procedure Laterality Date   FOOT SURGERY     KNEE SURGERY     TUBAL LIGATION      Prior to Admission medications   Not on File    Allergies as of 08/02/2022 - Review Complete 08/01/2022  Allergen Reaction Noted   Erythromycin  08/27/2017   Sulfa antibiotics Nausea And Vomiting 08/27/2017   Penicillins Rash 08/27/2017    Family History  Problem Relation Age of Onset   Hypertension Mother    Breast cancer Mother 3   COPD Father    Heart disease Father    Hyperlipidemia Sister    Hypertension Sister    Diabetes Sister    Breast cancer Paternal Grandmother     Social History   Socioeconomic History   Marital status: Divorced    Spouse name: Not on file   Number of children: Not on file   Years of education: Not on file   Highest education level: Not on file  Occupational History   Not on file  Tobacco Use   Smoking status: Former    Packs/day: 0.50    Types: Cigarettes    Quit date: 09/07/2009    Years since quitting: 12.9   Smokeless tobacco: Never  Vaping Use   Vaping Use: Never used  Substance and Sexual Activity   Alcohol use: Yes    Alcohol/week: 35.0 standard drinks of alcohol    Types: 35 Cans of beer per week   Drug use: No   Sexual activity: Not Currently  Other Topics Concern   Not on file  Social History Narrative   Not on file   Social Determinants of Health   Financial Resource Strain: Not on file  Food Insecurity: Not on file  Transportation Needs: Not on file  Physical Activity: Not on file  Stress: Not on file   Social Connections: Not on file  Intimate Partner Violence: Not on file    Review of Systems: See HPI, otherwise negative ROS  Physical Exam: BP 127/76   Temp 97.9 F (36.6 C) (Tympanic)   Ht 5' 5.98" (1.676 m)   Wt 78.4 kg   SpO2 95%   BMI 27.92 kg/m  General:   Alert,  pleasant and cooperative in NAD Head:  Normocephalic and atraumatic. Neck:  Supple; no masses or thyromegaly. Lungs:  Clear throughout to auscultation.    Heart:  Regular rate and rhythm. Abdomen:  Soft, nontender and nondistended. Normal bowel sounds, without guarding, and without rebound.   Neurologic:  Alert and  oriented x4;  grossly normal neurologically.  Impression/Plan: Lindsay Mcconnell is here for an colonoscopy to be performed for colon cancer screening  Risks, benefits, limitations, and alternatives regarding  colonoscopy have been reviewed with the patient.  Questions have been answered.  All parties agreeable.   Sherri Sear, MD  08/16/2022, 10:01 AM

## 2022-08-17 ENCOUNTER — Encounter: Payer: Self-pay | Admitting: Gastroenterology

## 2022-08-20 LAB — SURGICAL PATHOLOGY

## 2022-08-22 ENCOUNTER — Encounter: Payer: Self-pay | Admitting: Gastroenterology

## 2022-08-28 NOTE — Progress Notes (Unsigned)
Name: Lindsay Mcconnell   MRN: [id number]    DOB: October 12, 1961   Date:08/29/2022       Progress Note  Subjective  Chief Complaint  Chief Complaint  Patient presents with   Annual Exam    HPI  Patient presents for annual CPE.  Diet: quit drinking 2 weeks ago  Exercise: walking a lot at work - work  Last [employer] Exam: planning on getting one  Last Dental Exam: finding a new Pharmacist, community, hers is out of Environmental education officer Visit from 08/29/2022 in [facility]  AUDIT-C Score 0      Depression: Phq 9 is  negative    08/29/2022    8:01 AM 08/01/2022    8:39 AM  Depression screen PHQ 2/9  Decreased Interest 0 0  Down, Depressed, Hopeless 0 1  PHQ - 2 Score 0 1  Altered sleeping  0  Tired, decreased energy  0  Change in appetite  0  Feeling bad or failure about yourself   0  Trouble concentrating  0  Moving slowly or fidgety/restless  0  Suicidal thoughts  0  PHQ-9 Score  1  Difficult doing work/chores  Not difficult at all   Hypertension: BP Readings from Last 3 Encounters:  08/29/22 122/76  08/16/22 117/78  08/01/22 118/78   Obesity: Wt Readings from Last 3 Encounters:  08/29/22 176 lb 1.6 oz (79.9 kg)  08/16/22 172 lb 14.4 oz (78.4 kg)  08/01/22 175 lb 11.2 oz (79.7 kg)   BMI Readings from Last 3 Encounters:  08/29/22 28.00 kg/m  08/16/22 27.92 kg/m  08/01/22 27.93 kg/m     Vaccines:   HPV: N/A Tdap: 10/23 Shingrix: Due Pneumonia: N/A Flu: Up-to-date COVID-19: 2 vaccines   Hep C Screening: -2023 STD testing and prevention (HIV/chl/gon/syphilis): no concerns  Intimate partner violence: negative screen  Menstrual History/LMP/Abnormal Bleeding: no issues, post-menopausal  Discussed importance of follow up if any post-menopausal bleeding: yes  Incontinence Symptoms: negative for symptoms   Breast cancer:  - Last Mammogram: Scheduled for later today  Osteoporosis Prevention : Discussed high calcium and vitamin D  supplementation, weight bearing exercises Bone density :not applicable   Cervical cancer screening: Due   Skin cancer: Discussed monitoring for atypical lesions  Colorectal cancer: Colonoscopy 08/16/2022, repeat in 7 years   Lung cancer:  Low Dose CT Chest recommended if Age 33-80 years, 20 pack-year currently smoking OR have quit w/in 15years. Patient does qualify for screen but politely declines.  ECG: 08/28/2017  Advanced Care Planning: A voluntary discussion about advance care planning including the explanation and discussion of advance directives.  Discussed health care proxy and Living will, and the patient was unable to identify a health care proxy.  Patient does not have a living will and power of attorney of health care   Lipids: Lab Results  Component Value Date   CHOL 240 (H) 08/01/2022   Lab Results  Component Value Date   HDL 65 08/01/2022   Lab Results  Component Value Date   LDLCALC 149 (H) 08/01/2022   Lab Results  Component Value Date   TRIG 132 08/01/2022   Lab Results  Component Value Date   CHOLHDL 3.7 08/01/2022   No results found for: "LDLDIRECT"  Glucose: Glucose, Bld  Date Value Ref Range Status  08/01/2022 99 65 - 99 mg/dL Final    Comment:    .            Fasting  reference interval .   08/27/2017 107 (H) 65 - 99 mg/dL Final    Patient Active Problem List   Diagnosis Date Noted   Encounter for screening colonoscopy    Cecal polyp    Polyp of transverse colon     Past Surgical History:  Procedure Laterality Date   COLONOSCOPY WITH PROPOFOL N/A 08/16/2022   Procedure: COLONOSCOPY WITH PROPOFOL;  Surgeon: Lin Landsman, MD;  Location: [facility];  Service: Endoscopy;  Laterality: N/A;   FOOT SURGERY     KNEE SURGERY     POLYPECTOMY  08/16/2022   Procedure: POLYPECTOMY;  Surgeon: Lin Landsman, MD;  Location: [facility];  Service: Endoscopy;;   TUBAL LIGATION      Family History  Problem Relation Age  of Onset   Hypertension Mother    Breast cancer Mother 91   COPD Father    Heart disease Father    Hyperlipidemia Sister    Hypertension Sister    Diabetes Sister    Breast cancer Paternal Grandmother     Social History   Socioeconomic History   Marital status: Divorced    Spouse name: Not on file   Number of children: Not on file   Years of education: Not on file   Highest education level: Not on file  Occupational History   Not on file  Tobacco Use   Smoking status: Former    Packs/day: 0.50    Types: Cigarettes    Quit date: 09/07/2009    Years since quitting: 12.9   Smokeless tobacco: Never  Vaping Use   Vaping Use: Never used  Substance and Sexual Activity   Alcohol use: Yes    Alcohol/week: 35.0 standard drinks of alcohol    Types: 35 Cans of beer per week   Drug use: No   Sexual activity: Not Currently  Other Topics Concern   Not on file  Social History Narrative   Not on file   Social Determinants of Health   Financial Resource Strain: Low Risk  (08/29/2022)   Overall Financial Resource Strain (CARDIA)    Difficulty of Paying Living Expenses: Not hard at all  Food Insecurity: No Food Insecurity (08/29/2022)   Hunger Vital Sign    Worried About Running Out of Food in the Last Year: Never true    Ran Out of Food in the Last Year: Never true  Transportation Needs: No Transportation Needs (08/29/2022)   PRAPARE - Hydrologist (Medical): No    Lack of Transportation (Non-Medical): No  Physical Activity: Sufficiently Active (08/29/2022)   Exercise Vital Sign    Days of Exercise per Week: 7 days    Minutes of Exercise per Session: 150+ min  Stress: No Stress Concern Present (08/29/2022)   [place]    Feeling of Stress : Only a little  Social Connections: Not on file  Intimate Partner Violence: Not At Risk (08/29/2022)   Humiliation, Afraid, Rape, and Kick  questionnaire    Fear of Current or Ex-Partner: No    Emotionally Abused: No    Physically Abused: No    Sexually Abused: No    No current outpatient medications on file.  Allergies  Allergen Reactions   Erythromycin     dizziness   Sulfa Antibiotics Nausea And Vomiting   Penicillins Rash    Has patient had a PCN reaction causing immediate rash, facial/tongue/throat swelling, SOB or lightheadedness with  hypotension: No Has patient had a PCN reaction causing severe rash involving mucus membranes or skin necrosis: No Has patient had a PCN reaction that required hospitalization: No Has patient had a PCN reaction occurring within the last 10 years: No If all of the above answers are "NO", then may proceed with Cephalosporin use.      Review of Systems  All other systems reviewed and are negative.   Objective  Vitals:   08/29/22 0801  BP: 122/76  Pulse: 64  Resp: 18  Temp: 97.8 F (36.6 C)  TempSrc: Oral  SpO2: 97%  Weight: 176 lb 1.6 oz (79.9 kg)  Height: 5' 6.5" (1.689 m)    Body mass index is 28 kg/m.  Physical Exam Exam conducted with a chaperone present.  Constitutional:      Appearance: Normal appearance.  HENT:     Head: Normocephalic and atraumatic.  Eyes:     Conjunctiva/sclera: Conjunctivae normal.  Cardiovascular:     Rate and Rhythm: Normal rate and regular rhythm.  Pulmonary:     Effort: Pulmonary effort is normal.     Breath sounds: Normal breath sounds.  Genitourinary:    Comments: External genitalia within normal limits.  Vaginal mucosa pink, moist, normal rugae.  Nonfriable cervix without lesions, no discharge or bleeding noted on speculum exam.  Bimanual exam revealed normal, nongravid uterus.  No cervical motion tenderness. No adnexal masses bilaterally.    Skin:    General: Skin is warm and dry.  Neurological:     General: No focal deficit present.     Mental Status: She is alert. Mental status is at baseline.  Psychiatric:         Mood and Affect: Mood normal.        Behavior: Behavior normal.     Recent Results (from the past 2160 hour(s))  Hepatitis C Antibody     Status: None   Collection Time: 08/01/22  9:25 AM  Result Value Ref Range   Hepatitis C Ab NON-REACTIVE NON-REACTIVE    Comment: . HCV antibody was non-reactive. There is no laboratory  evidence of HCV infection. . In most cases, no further action is required. However, if recent HCV exposure is suspected, a test for HCV RNA (test code [id number]) is suggested. . For additional information please refer to http://education.[facility].com/faq/FAQ22v1 (This link is being provided for informational/ educational purposes only.) .   HIV antibody (with reflex)     Status: None   Collection Time: 08/01/22  9:25 AM  Result Value Ref Range   HIV 1&2 Ab, 4th Generation NON-REACTIVE NON-REACTIVE    Comment: HIV-1 antigen and HIV-1/HIV-2 antibodies were not detected. There is no laboratory evidence of HIV infection. Marland Kitchen PLEASE NOTE: This information has been disclosed to you from records whose confidentiality may be protected by state law.  If your state requires such protection, then the state law prohibits you from making any further disclosure of the information without the specific written consent of the person to whom it pertains, or as otherwise permitted by law. A general authorization for the release of medical or other information is NOT sufficient for this purpose. . For additional information please refer to http://education.[facility].com/faq/FAQ106 (This link is being provided for informational/ educational purposes only.) . Marland Kitchen The performance of this assay has not been clinically validated in patients less than 30 years old. Marland Kitchen   CBC w/Diff/Platelet     Status: None   Collection Time: 08/01/22  9:25 AM  Result Value Ref  Range   WBC 6.1 3.8 - 10.8 Thousand/uL   RBC 4.70 3.80 - 5.10 Million/uL   Hemoglobin 14.2 11.7 - 15.5  g/dL   HCT 41.5 35.0 - 45.0 %   MCV 88.3 80.0 - 100.0 fL   MCH 30.2 27.0 - 33.0 pg   MCHC 34.2 32.0 - 36.0 g/dL   RDW 12.8 11.0 - 15.0 %   Platelets 279 140 - 400 Thousand/uL   MPV 10.2 7.5 - 12.5 fL   Neutro Abs 3,172 1,500 - 7,800 cells/uL   Lymphs Abs 2,202 850 - 3,900 cells/uL   Absolute Monocytes 427 200 - 950 cells/uL   Eosinophils Absolute 238 15 - 500 cells/uL   Basophils Absolute 61 0 - 200 cells/uL   Neutrophils Relative % 52 %   Total Lymphocyte 36.1 %   Monocytes Relative 7.0 %   Eosinophils Relative 3.9 %   Basophils Relative 1.0 %  COMPLETE METABOLIC PANEL WITH GFR     Status: None   Collection Time: 08/01/22  9:25 AM  Result Value Ref Range   Glucose, Bld 99 65 - 99 mg/dL    Comment: .            Fasting reference interval .    BUN 21 7 - 25 mg/dL   Creat 0.86 0.50 - 1.03 mg/dL   eGFR 78 > OR = 60 mL/min/1.12m   BUN/Creatinine Ratio SEE NOTE: 6 - 22 (calc)    Comment:    Not Reported: BUN and Creatinine are within    reference range. .    Sodium 140 135 - 146 mmol/L   Potassium 4.4 3.5 - 5.3 mmol/L   Chloride 104 98 - 110 mmol/L   CO2 26 20 - 32 mmol/L   Calcium 9.1 8.6 - 10.4 mg/dL   Total Protein 7.0 6.1 - 8.1 g/dL   Albumin 4.5 3.6 - 5.1 g/dL   Globulin 2.5 1.9 - 3.7 g/dL (calc)   AG Ratio 1.8 1.0 - 2.5 (calc)   Total Bilirubin 0.4 0.2 - 1.2 mg/dL   Alkaline phosphatase (APISO) 58 37 - 153 U/L   AST 18 10 - 35 U/L   ALT 15 6 - 29 U/L  Lipid Profile     Status: Abnormal   Collection Time: 08/01/22  9:25 AM  Result Value Ref Range   Cholesterol 240 (H) <200 mg/dL   HDL 65 > OR = 50 mg/dL   Triglycerides 132 <150 mg/dL   LDL Cholesterol (Calc) 149 (H) mg/dL (calc)    Comment: Reference range: <100 . Desirable range <100 mg/dL for primary prevention;   <70 mg/dL for patients with CHD or diabetic patients  with > or = 2 CHD risk factors. .Marland KitchenLDL-C is now calculated using the Toscano-Hopkins  calculation, which is a validated novel method  providing  better accuracy than the Friedewald equation in the  estimation of LDL-C.  MCresenciano Genreet al. JAnnamaria Helling 25621;308(65: 2061-2068  (http://education.[facility].com/faq/FAQ164)    Total CHOL/HDL Ratio 3.7 <5.0 (calc)   Non-HDL Cholesterol (Calc) 175 (H) <130 mg/dL (calc)    Comment: For patients with diabetes plus 1 major ASCVD risk  factor, treating to a non-HDL-C goal of <100 mg/dL  (LDL-C of <70 mg/dL) is considered a therapeutic  option.   Surgical pathology     Status: None   Collection Time: 08/16/22 10:38 AM  Result Value Ref Range   SURGICAL PATHOLOGY      SURGICAL PATHOLOGY CASE: [id number] PATIENT: AVevelyn PatSurgical  Pathology Report     Specimen Submitted: A. Colon polyp, cecum; bx B. Colon polyp, transverse; cold snare  Clinical History: Screening colonoscopy.  Colon polyps      DIAGNOSIS: A.  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 - NO ATYPIA  B.  COLON, TRANSVERSE, POLYP; BIOPSIES: - MULTIPLE FRAGMENTS OF TUBULAR ADENOMA. - NO EVIDENCE OF HIGH-GRADE DYSPLASIA OR MALIGNANCY.  Comment: The polypoid portion of colonic mucosa in part A shows focal increased number of S100 positive spindle cells with no atypia and no whirling, no palisading and no fasciculation.  GROSS DESCRIPTION: A. Labeled: Cecum polyp biopsy Received: Formalin Collection time: 10:38 AM on 08/16/2022 Placed into formalin time: 10:38 AM on 08/16/2022 Tissue fragment(s): 1 Size: 0.4 x 0.3 x 0.1 cm Description: Tan soft tissue fragment Entirely submitted in 1  cassette.  B. Labeled: Transverse colon polyp cold snare Received: Formalin Collection time: 10:48 AM on 08/16/2022 Placed into formalin time: 10:48 AM on 08/16/2022 Tissue fragment(s): Multiple Size: Aggregate, 1.2 x 0.6 x 0.3 cm Description: Received are 3 fragments of tan soft tissue admixed with intestinal debris.  The ratio of soft  tissue to intestinal debris is 95: 5.  The 2 larger soft tissue fragments have resection margins which are differentially inked.  These fragments are sectioned. Entirely submitted in cassettes 1-2 with 1 serially sectioned fragment in cassette 1 and 1 trisected fragment with the remaining fragments in cassette 2.  RB 08/17/2022  Final Diagnosis performed by Theodora Blow, MD.   Electronically signed 08/20/2022 4:22:43PM The electronic signature indicates that the named Attending Pathologist has evaluated the specimen Technical component performed at [facility], [address] [place], [place] [address] Lab: [phone] Dir: Tera Partridge a, MD, MMM  Professional component performed at [facility], [facility], [place], [place], [place] [address] Lab: [phone] Dir: Kathi Simpers, MD      Fall Risk:    08/29/2022    8:01 AM 08/01/2022    8:39 AM  Dawn in the past year? 0 0  Number falls in past yr: 0 0  Injury with Fall? 0 0  Follow up Falls evaluation completed     Functional Status Survey: Is the patient deaf or have difficulty hearing?: No Does the patient have difficulty seeing, even when wearing glasses/contacts?: No Does the patient have difficulty concentrating, remembering, or making decisions?: No Does the patient have difficulty walking or climbing stairs?: No Does the patient have difficulty dressing or bathing?: No Does the patient have difficulty doing errands alone such as visiting a doctor's office or shopping?: No  Assessment & Plan  1. Annual physical exam/Cervical cancer screening: Pap today. Follow up in 1 year or sooner as needed.   - Cytology - PAP   -USPSTF grade A and B recommendations reviewed with patient; age-appropriate recommendations, preventive care, screening tests, etc discussed and encouraged; healthy living encouraged; see AVS for patient education given to patient -Discussed importance of 150 minutes of  physical activity weekly, eat two servings of fish weekly, eat one serving of tree nuts ( cashews, pistachios, pecans, almonds.Marland Kitchen) every other day, eat 6 servings of fruit/vegetables daily and drink plenty of water and avoid sweet beverages.   -Reviewed Health Maintenance: yes

## 2022-08-29 ENCOUNTER — Encounter: Payer: Self-pay | Admitting: Internal Medicine

## 2022-08-29 ENCOUNTER — Other Ambulatory Visit: Payer: Self-pay

## 2022-08-29 ENCOUNTER — Ambulatory Visit
Admission: RE | Admit: 2022-08-29 | Discharge: 2022-08-29 | Disposition: A | Discharge status: Home / Self Care | Payer: 59 | Source: Ambulatory Visit | Attending: Internal Medicine | Admitting: Internal Medicine

## 2022-08-29 ENCOUNTER — Ambulatory Visit (INDEPENDENT_AMBULATORY_CARE_PROVIDER_SITE_OTHER): Payer: 59 | Admitting: Internal Medicine

## 2022-08-29 ENCOUNTER — Other Ambulatory Visit (HOSPITAL_COMMUNITY)
Admission: RE | Admit: 2022-08-29 | Discharge: 2022-08-29 | Disposition: A | Discharge status: Home / Self Care | Payer: 59 | Source: Ambulatory Visit | Attending: Internal Medicine | Admitting: Internal Medicine

## 2022-08-29 VITALS — BP 122/76 | HR 64 | Temp 97.8°F | Resp 18 | Ht 66.5 in | Wt 176.1 lb

## 2022-08-29 DIAGNOSIS — Z0000 Encounter for general adult medical examination without abnormal findings: Secondary | ICD-10-CM

## 2022-08-29 DIAGNOSIS — Z1231 Encounter for screening mammogram for malignant neoplasm of breast: Secondary | ICD-10-CM | POA: Insufficient documentation

## 2022-08-29 DIAGNOSIS — Z124 Encounter for screening for malignant neoplasm of cervix: Secondary | ICD-10-CM | POA: Insufficient documentation

## 2022-08-30 LAB — CYTOLOGY - PAP
Adequacy: ABSENT
Comment: NEGATIVE
Diagnosis: NEGATIVE
High risk HPV: NEGATIVE

## 2022-09-19 ENCOUNTER — Encounter: Payer: Self-pay | Admitting: Internal Medicine

## 2022-09-25 NOTE — Progress Notes (Unsigned)
   There were no vitals taken for this visit.   Subjective:    Patient ID: Lindsay Mcconnell, female    DOB: 1961-07-03, 61 y.o.   MRN: [id number]  HPI: Lindsay Mcconnell is a 61 y.o. female  No chief complaint on file.   Relevant past medical, surgical, family and social history reviewed and updated as indicated. Interim medical history since our last visit reviewed. Allergies and medications reviewed and updated.  Review of Systems  Constitutional: Negative for fever or weight change.  Respiratory: Negative for cough and shortness of breath.   Cardiovascular: Negative for chest pain or palpitations.  Gastrointestinal: Negative for abdominal pain, no bowel changes.  Musculoskeletal: Negative for gait problem or joint swelling.  Skin: Negative for rash.  Neurological: Negative for dizziness or headache.  No other specific complaints in a complete review of systems (except as listed in HPI above).      Objective:    There were no vitals taken for this visit.  Wt Readings from Last 3 Encounters:  08/29/22 176 lb 1.6 oz (79.9 kg)  08/16/22 172 lb 14.4 oz (78.4 kg)  08/01/22 175 lb 11.2 oz (79.7 kg)    Physical Exam  Constitutional: Patient appears well-developed and well-nourished. Obese *** No distress.  HEENT: head atraumatic, normocephalic, pupils equal and reactive to light, ears ***, neck supple, throat within normal limits Cardiovascular: Normal rate, regular rhythm and normal heart sounds.  No murmur heard. No BLE edema. Pulmonary/Chest: Effort normal and breath sounds normal. No respiratory distress. Abdominal: Soft.  There is no tenderness. Psychiatric: Patient has a normal mood and affect. behavior is normal. Judgment and thought content normal.  Results for orders placed or performed in visit on 08/29/22  Cytology - PAP  Result Value Ref Range   High risk HPV Negative    Adequacy      Satisfactory for evaluation; transformation zone component ABSENT.   Diagnosis       - Negative for intraepithelial lesion or malignancy (NILM)   Comment Normal Reference Range HPV - Negative       Assessment & Plan:   Problem List Items Addressed This Visit   None    Follow up plan: No follow-ups on file.

## 2022-09-26 ENCOUNTER — Other Ambulatory Visit: Payer: Self-pay

## 2022-09-26 ENCOUNTER — Ambulatory Visit (INDEPENDENT_AMBULATORY_CARE_PROVIDER_SITE_OTHER): Payer: 59 | Admitting: Nurse Practitioner

## 2022-09-26 ENCOUNTER — Encounter: Payer: Self-pay | Admitting: Nurse Practitioner

## 2022-09-26 VITALS — BP 118/72 | HR 67 | Temp 98.2°F | Resp 18 | Ht 66.5 in | Wt 175.5 lb

## 2022-09-26 DIAGNOSIS — R591 Generalized enlarged lymph nodes: Secondary | ICD-10-CM | POA: Diagnosis not present

## 2022-09-26 DIAGNOSIS — J029 Acute pharyngitis, unspecified: Secondary | ICD-10-CM | POA: Diagnosis not present

## 2022-09-26 DIAGNOSIS — J351 Hypertrophy of tonsils: Secondary | ICD-10-CM

## 2022-09-26 MED ORDER — PREDNISONE 10 MG (21) PO TBPK: ORAL_TABLET | ORAL | 0 refills | Status: DC

## 2022-09-28 DIAGNOSIS — Z419 Encounter for procedure for purposes other than remedying health state, unspecified: Secondary | ICD-10-CM | POA: Diagnosis not present

## 2022-10-29 DIAGNOSIS — Z419 Encounter for procedure for purposes other than remedying health state, unspecified: Secondary | ICD-10-CM | POA: Diagnosis not present

## 2022-11-29 DIAGNOSIS — Z419 Encounter for procedure for purposes other than remedying health state, unspecified: Secondary | ICD-10-CM | POA: Diagnosis not present

## 2022-12-28 DIAGNOSIS — Z419 Encounter for procedure for purposes other than remedying health state, unspecified: Secondary | ICD-10-CM | POA: Diagnosis not present

## 2023-01-19 ENCOUNTER — Emergency Department: Payer: BC Managed Care – PPO

## 2023-01-19 ENCOUNTER — Encounter: Payer: Self-pay | Admitting: Emergency Medicine

## 2023-01-19 ENCOUNTER — Emergency Department
Admission: EM | Admit: 2023-01-19 | Discharge: 2023-01-19 | Disposition: A | Discharge status: Home / Self Care | Payer: BC Managed Care – PPO | Attending: Emergency Medicine | Admitting: Emergency Medicine

## 2023-01-19 ENCOUNTER — Other Ambulatory Visit: Payer: Self-pay

## 2023-01-19 DIAGNOSIS — M79672 Pain in left foot: Secondary | ICD-10-CM | POA: Diagnosis not present

## 2023-01-19 DIAGNOSIS — W2201XA Walked into wall, initial encounter: Secondary | ICD-10-CM | POA: Diagnosis not present

## 2023-01-19 DIAGNOSIS — S93105A Unspecified dislocation of left toe(s), initial encounter: Secondary | ICD-10-CM | POA: Diagnosis not present

## 2023-01-19 DIAGNOSIS — S99922A Unspecified injury of left foot, initial encounter: Secondary | ICD-10-CM | POA: Diagnosis present

## 2023-01-19 NOTE — ED Provider Notes (Signed)
[facility] Provider Note    Event Date/Time   First MD Initiated Contact with Patient 01/19/23 1055     (approximate)   History   Toe Injury   HPI  Lindsay Mcconnell is a 62 y.o. female   presents to the ED with complaint of left fifth toe pain after patient hit her toe on the corner of a wall this morning.  Patient states that she bent the toe sideways and that she pushed the toe back into place.      Physical Exam   Triage Vital Signs: ED Triage Vitals  Enc Vitals Group     BP 01/19/23 0937 134/70     Pulse Rate 01/19/23 0937 60     Resp 01/19/23 0937 20     Temp 01/19/23 0937 98.1 F (36.7 C)     Temp Source 01/19/23 0937 Oral     SpO2 01/19/23 0937 95 %     Weight 01/19/23 0933 174 lb 2.6 oz (79 kg)     Height 01/19/23 0933 5\' 6"  (1.676 m)     Head Circumference --      [place] --      Pain Score --      Pain Loc --      Pain Edu? --      Excl. in [place]? --     Most recent vital signs: Vitals:   01/19/23 0937  BP: 134/70  Pulse: 60  Resp: 20  Temp: 98.1 F (36.7 C)  SpO2: 95%     General: Awake, no distress.  CV:  Good peripheral perfusion.  Resp:  Normal effort.  Abd:  No distention.  Other:  Left foot exam shows the fifth digit in good alignment with minimal soft tissue edema.  There is some ecchymosis noted at the base of the toe.  Motor or sensory function intact.  Skin is intact.  Cap refills less than 3 seconds.   ED Results / Procedures / Treatments   Labs (all labs ordered are listed, but only abnormal results are displayed) Labs Reviewed - No data to display   RADIOLOGY  Left foot x-ray images were reviewed and interpreted by myself independent of the radiologist and was negative for dislocation or fracture.  Radiology report confirms the same.   PROCEDURES:  Critical Care performed:   Procedures   MEDICATIONS ORDERED IN ED: Medications - No data to display   IMPRESSION / MDM / [place]  / ED COURSE  I reviewed the triage vital signs and the nursing notes.   Differential diagnosis includes, but is not limited to, fracture, dislocation, contusion, sprain left fifth toe.  62 year old female presents to the ED after an injury to her left fifth toe causing it to protrude out and patient describes a dislocation.  She was able to pull it back into place at home.  Good alignment and x-rays are negative for fracture.  Patient was made aware.  For now we will buddy tape and place her in a postop shoe for protection and support.  Patient declined the offer of any narcotic pain medication and will take over-the-counter medication, ice and elevation.  She is to follow-up with podiatry if any continued problems.      Patient's presentation is most consistent with acute complicated illness / injury requiring diagnostic workup.  FINAL CLINICAL IMPRESSION(S) / ED DIAGNOSES   Final diagnoses:  Toe dislocation, left, initial encounter     Rx /  DC Orders   ED Discharge Orders     None        Note:  This document was prepared using Dragon voice recognition software and may include unintentional dictation errors.   Johnn Hai, PA-C 01/19/23 1128    Lavonia Drafts, MD 01/19/23 1213

## 2023-01-19 NOTE — ED Triage Notes (Signed)
Pt reports left foot pinky toe caught the corner of a wall this am and bent all the way to the side. Pt reports she was able to bend it back in place but it continues to throb in pain.

## 2023-01-19 NOTE — Discharge Instructions (Addendum)
Buddy tape the fourth and fifth toe together for support and protection.  You can follow-up with the podiatrist listed on your discharge papers if you continue to have problems with your toe or any concerns.  Ice and elevation to reduce swelling and help with pain.  You may also take over-the-counter Tylenol or ibuprofen as needed for discomfort.

## 2023-01-28 DIAGNOSIS — Z419 Encounter for procedure for purposes other than remedying health state, unspecified: Secondary | ICD-10-CM | POA: Diagnosis not present

## 2023-02-11 ENCOUNTER — Ambulatory Visit (INDEPENDENT_AMBULATORY_CARE_PROVIDER_SITE_OTHER): Payer: BC Managed Care – PPO | Admitting: Podiatry

## 2023-02-11 ENCOUNTER — Encounter: Payer: Self-pay | Admitting: Podiatry

## 2023-02-11 VITALS — BP 130/77

## 2023-02-11 DIAGNOSIS — Z969 Presence of functional implant, unspecified: Secondary | ICD-10-CM | POA: Diagnosis not present

## 2023-02-11 DIAGNOSIS — M2042 Other hammer toe(s) (acquired), left foot: Secondary | ICD-10-CM

## 2023-02-11 DIAGNOSIS — S93105D Unspecified dislocation of left toe(s), subsequent encounter: Secondary | ICD-10-CM

## 2023-02-11 DIAGNOSIS — Z9889 Other specified postprocedural states: Secondary | ICD-10-CM | POA: Diagnosis not present

## 2023-02-12 NOTE — Progress Notes (Signed)
  Subjective:  Patient ID: Lindsay Mcconnell, female    DOB: Mar 21, 1961,  MRN: [id number]  Chief Complaint  Patient presents with   Foot Pain    "I dislocated my toe.  I moved it back myself." N - dislocated toe L - 5th digit left D - 4 weeks ago O - suddenly, gotten better C - swelling, was throbbing, A - shoes were aggravating it T [facility] emergency, black shoe, taped toes together    62 y.o. female presents with the above complaint. History confirmed with patient.  She also has a history of neuroma and bunion surgery has screws in her right foot approximately 20 years ago  Objective:  Physical Exam: warm, good capillary refill, no trophic changes or ulcerative lesions, normal DP and PT pulses, normal sensory exam, and edematous left fifth toe, slight tenderness, no instability dislocation, good range of motion of joint.  Well-healed surgical scars.  Range of motion intact to MTPJ's.   Radiographs: Multiple views x-ray of the left foot: Radiographs from ER 01/19/2023 showed no fracture Assessment:   1. Closed dislocation of toe, left, subsequent encounter   2. Retained orthopedic hardware   3. History of bunionectomy      Plan:  Patient was evaluated and treated and all questions answered.  Dislocation appears to be healing well, should be able to discontinue buddy splinting soon.  Continue to ice and modify activity as tolerated.  Regarding her previous neuroma and bunion surgeries the joints move well and she does have chronic pain in these areas but there are no pinpoint areas that I expect there is much that can be done with this at this point.  I do not think that the retained hardware is a contributing factor to her pain  Return if symptoms worsen or fail to improve.

## 2023-02-27 DIAGNOSIS — Z419 Encounter for procedure for purposes other than remedying health state, unspecified: Secondary | ICD-10-CM | POA: Diagnosis not present

## 2023-03-30 DIAGNOSIS — Z419 Encounter for procedure for purposes other than remedying health state, unspecified: Secondary | ICD-10-CM | POA: Diagnosis not present

## 2023-04-29 DIAGNOSIS — Z419 Encounter for procedure for purposes other than remedying health state, unspecified: Secondary | ICD-10-CM | POA: Diagnosis not present

## 2023-05-15 ENCOUNTER — Telehealth: Payer: Self-pay | Admitting: Internal Medicine

## 2023-05-15 NOTE — Telephone Encounter (Signed)
Copied from CRM [id number]. Topic: General - Other >> May 15, 2023 12:18 PM Phill Myron wrote: Toniann Fail with [facility]  calling in regards to incontinence supplies for the patient

## 2023-05-15 NOTE — Telephone Encounter (Signed)
When got the paperwork a couple of weeks ago called patient regarding supplies b/c we had no documentation of this.  She refused and stated she did not need supplies? If Pt needs she will need to schedule an appt to discuss and document for ins purposes.

## 2023-05-30 DIAGNOSIS — Z419 Encounter for procedure for purposes other than remedying health state, unspecified: Secondary | ICD-10-CM | POA: Diagnosis not present

## 2023-06-03 DIAGNOSIS — J351 Hypertrophy of tonsils: Secondary | ICD-10-CM | POA: Diagnosis not present

## 2023-06-03 DIAGNOSIS — G4730 Sleep apnea, unspecified: Secondary | ICD-10-CM | POA: Diagnosis not present

## 2023-06-03 DIAGNOSIS — K219 Gastro-esophageal reflux disease without esophagitis: Secondary | ICD-10-CM | POA: Diagnosis not present

## 2023-06-30 DIAGNOSIS — Z419 Encounter for procedure for purposes other than remedying health state, unspecified: Secondary | ICD-10-CM | POA: Diagnosis not present

## 2023-07-30 DIAGNOSIS — Z419 Encounter for procedure for purposes other than remedying health state, unspecified: Secondary | ICD-10-CM | POA: Diagnosis not present

## 2023-08-30 DIAGNOSIS — Z419 Encounter for procedure for purposes other than remedying health state, unspecified: Secondary | ICD-10-CM | POA: Diagnosis not present

## 2023-09-29 DIAGNOSIS — Z419 Encounter for procedure for purposes other than remedying health state, unspecified: Secondary | ICD-10-CM | POA: Diagnosis not present

## 2023-11-10 DIAGNOSIS — Z419 Encounter for procedure for purposes other than remedying health state, unspecified: Secondary | ICD-10-CM | POA: Diagnosis not present

## 2023-11-30 DIAGNOSIS — Z419 Encounter for procedure for purposes other than remedying health state, unspecified: Secondary | ICD-10-CM | POA: Diagnosis not present

## 2023-12-28 DIAGNOSIS — Z419 Encounter for procedure for purposes other than remedying health state, unspecified: Secondary | ICD-10-CM | POA: Diagnosis not present

## 2024-02-08 DIAGNOSIS — Z419 Encounter for procedure for purposes other than remedying health state, unspecified: Secondary | ICD-10-CM | POA: Diagnosis not present

## 2024-03-09 DIAGNOSIS — Z419 Encounter for procedure for purposes other than remedying health state, unspecified: Secondary | ICD-10-CM | POA: Diagnosis not present

## 2024-04-09 DIAGNOSIS — Z419 Encounter for procedure for purposes other than remedying health state, unspecified: Secondary | ICD-10-CM | POA: Diagnosis not present

## 2024-04-13 ENCOUNTER — Other Ambulatory Visit: Payer: Self-pay | Admitting: Internal Medicine

## 2024-04-13 ENCOUNTER — Encounter: Payer: Self-pay | Admitting: Internal Medicine

## 2024-04-13 DIAGNOSIS — Z1231 Encounter for screening mammogram for malignant neoplasm of breast: Secondary | ICD-10-CM

## 2024-04-14 ENCOUNTER — Ambulatory Visit
Admission: RE | Admit: 2024-04-14 | Discharge: 2024-04-14 | Discharge status: Home / Self Care | Source: Ambulatory Visit | Attending: Internal Medicine

## 2024-04-14 DIAGNOSIS — Z1231 Encounter for screening mammogram for malignant neoplasm of breast: Secondary | ICD-10-CM | POA: Insufficient documentation

## 2024-04-16 ENCOUNTER — Ambulatory Visit: Payer: Self-pay | Admitting: Internal Medicine

## 2024-05-07 ENCOUNTER — Other Ambulatory Visit: Payer: Self-pay

## 2024-05-07 ENCOUNTER — Ambulatory Visit (INDEPENDENT_AMBULATORY_CARE_PROVIDER_SITE_OTHER): Admitting: Internal Medicine

## 2024-05-07 ENCOUNTER — Encounter: Payer: Self-pay | Admitting: Internal Medicine

## 2024-05-07 VITALS — BP 120/80 | HR 68 | Temp 98.0°F | Resp 16 | Ht 66.5 in | Wt 180.1 lb

## 2024-05-07 DIAGNOSIS — E559 Vitamin D deficiency, unspecified: Secondary | ICD-10-CM | POA: Diagnosis not present

## 2024-05-07 DIAGNOSIS — Z13220 Encounter for screening for lipoid disorders: Secondary | ICD-10-CM | POA: Diagnosis not present

## 2024-05-07 DIAGNOSIS — Z0000 Encounter for general adult medical examination without abnormal findings: Secondary | ICD-10-CM | POA: Diagnosis not present

## 2024-05-07 DIAGNOSIS — F419 Anxiety disorder, unspecified: Secondary | ICD-10-CM

## 2024-05-07 DIAGNOSIS — K219 Gastro-esophageal reflux disease without esophagitis: Secondary | ICD-10-CM | POA: Diagnosis not present

## 2024-05-07 LAB — LIPID PANEL
Cholesterol: 222 mg/dL — ABNORMAL HIGH (ref <200)
HDL: 53 mg/dL (ref >=50)
LDL Cholesterol (Calc): 135 mg/dL — ABNORMAL HIGH
Non-HDL Cholesterol (Calc): 169 mg/dL — ABNORMAL HIGH (ref <130)
Total CHOL/HDL Ratio: 4.2 (calc) (ref <5.0)
Triglycerides: 206 mg/dL — ABNORMAL HIGH (ref <150)

## 2024-05-07 LAB — CBC WITH DIFFERENTIAL/PLATELET
Absolute Lymphocytes: 3069 {cells}/uL (ref 850–3900)
Absolute Monocytes: 653 {cells}/uL (ref 200–950)
Basophils Absolute: 89 {cells}/uL (ref 0–200)
Basophils Relative: 0.9 %
Eosinophils Absolute: 327 {cells}/uL (ref 15–500)
Eosinophils Relative: 3.3 %
HCT: 42.5 % (ref 35.0–45.0)
Hemoglobin: 13.7 g/dL (ref 11.7–15.5)
MCH: 28.5 pg (ref 27.0–33.0)
MCHC: 32.2 g/dL (ref 32.0–36.0)
MCV: 88.5 fL (ref 80.0–100.0)
MPV: 10.4 fL (ref 7.5–12.5)
Monocytes Relative: 6.6 %
Neutro Abs: 5762 {cells}/uL (ref 1500–7800)
Neutrophils Relative %: 58.2 %
Platelets: 304 Thousand/uL (ref 140–400)
RBC: 4.8 Million/uL (ref 3.80–5.10)
RDW: 13 % (ref 11.0–15.0)
Total Lymphocyte: 31 %
WBC: 9.9 Thousand/uL (ref 3.8–10.8)

## 2024-05-07 LAB — COMPREHENSIVE METABOLIC PANEL WITH GFR
AG Ratio: 1.8 (calc) (ref 1.0–2.5)
ALT: 17 U/L (ref 6–29)
AST: 18 U/L (ref 10–35)
Albumin: 4.6 g/dL (ref 3.6–5.1)
Alkaline phosphatase (APISO): 60 U/L (ref 37–153)
BUN: 14 mg/dL (ref 7–25)
CO2: 28 mmol/L (ref 20–32)
Calcium: 9.2 mg/dL (ref 8.6–10.4)
Chloride: 100 mmol/L (ref 98–110)
Creat: 0.93 mg/dL (ref 0.50–1.05)
Globulin: 2.5 g/dL (ref 1.9–3.7)
Glucose, Bld: 96 mg/dL (ref 65–99)
Potassium: 4.3 mmol/L (ref 3.5–5.3)
Sodium: 140 mmol/L (ref 135–146)
Total Bilirubin: 0.3 mg/dL (ref 0.2–1.2)
Total Protein: 7.1 g/dL (ref 6.1–8.1)
eGFR: 70 mL/min/1.73m2 (ref >=60)

## 2024-05-07 LAB — VITAMIN D 25 HYDROXY (VIT D DEFICIENCY, FRACTURES): Vit D, 25-Hydroxy: 54 ng/mL (ref 30–100)
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
## 2024-05-08 ENCOUNTER — Ambulatory Visit: Payer: Self-pay | Admitting: Internal Medicine

## 2024-05-09 DIAGNOSIS — Z419 Encounter for procedure for purposes other than remedying health state, unspecified: Secondary | ICD-10-CM | POA: Diagnosis not present

## 2024-06-09 DIAGNOSIS — Z419 Encounter for procedure for purposes other than remedying health state, unspecified: Secondary | ICD-10-CM | POA: Diagnosis not present

## 2024-08-28 ENCOUNTER — Other Ambulatory Visit: Payer: Self-pay

## 2024-08-28 ENCOUNTER — Encounter: Payer: Self-pay | Admitting: Emergency Medicine

## 2024-08-28 ENCOUNTER — Emergency Department
Admission: EM | Admit: 2024-08-28 | Discharge: 2024-08-28 | Disposition: A | Discharge status: Home / Self Care | Payer: Self-pay | Attending: Emergency Medicine | Admitting: Emergency Medicine

## 2024-08-28 DIAGNOSIS — K645 Perianal venous thrombosis: Secondary | ICD-10-CM | POA: Insufficient documentation

## 2024-08-28 MED ORDER — OXYCODONE HCL 5 MG PO TABS
5.0000 mg | ORAL_TABLET | Freq: Once | ORAL | Status: AC
  Administered 2024-08-28: 5 mg via ORAL
  Filled 2024-08-28: qty 1

## 2024-08-28 MED ORDER — DOCUSATE SODIUM 100 MG PO CAPS: 100.0000 mg | ORAL_CAPSULE | Freq: Two times a day (BID) | ORAL | 2 refills | Status: AC

## 2024-08-28 MED ORDER — HYDROCODONE-ACETAMINOPHEN 5-325 MG PO TABS: 1.0000 | ORAL_TABLET | Freq: Four times a day (QID) | ORAL | 0 refills | Status: AC | PRN

## 2024-08-28 MED ORDER — LIDOCAINE-PRILOCAINE 2.5-2.5 % EX CREA
TOPICAL_CREAM | Freq: Once | CUTANEOUS | Status: AC
  Filled 2024-08-28: qty 5

## 2024-08-28 MED ORDER — LIDOCAINE HCL (PF) 1 % IJ SOLN
5.0000 mL | Freq: Once | INTRAMUSCULAR | Status: AC
  Administered 2024-08-28: 5 mL
  Filled 2024-08-28: qty 5

## 2024-08-28 NOTE — ED Notes (Signed)
 Patient declined discharge vital signs.

## 2024-08-28 NOTE — ED Notes (Signed)
 Patient states she has two external hemorrhoids that are bleeding. Patient states she is wearing a pad due to the blood.

## 2024-08-28 NOTE — Discharge Instructions (Signed)
 Follow-up with Dr. Marinda who is on-call for surgery if any continued problems or any worsening of your symptoms.  Hemorrhoids will remain however the thrombosed hemorrhoid should continue to go down in size.  With sitz bath's you may continue to see some blood clots in the water .  Pain medication was sent to the pharmacy along with a stool softener to prevent constipation which will make your pain worse.  Return to the emergency department if any severe worsening of your symptoms.

## 2024-08-28 NOTE — ED Provider Notes (Signed)
 [facility] Provider Note    Event Date/Time   First MD Initiated Contact with Patient 08/28/24 1045     (approximate)   History   Hemorrhoids   HPI  Lindsay Mcconnell is a 63 y.o. female   presents to the ED with complaint of hemorrhoids.  Patient states that she has had some difficulty for a while however this became much worse yesterday.  Patient states she recently had a GI bug which caused her hemorrhoids to be much worse.  She states that it is painful that she cannot stand it.      Physical Exam   Triage Vital Signs: ED Triage Vitals [08/28/24 1030]  Encounter Vitals Group     BP 102/89     Girls Systolic BP Percentile      Girls Diastolic BP Percentile      Boys Systolic BP Percentile      Boys Diastolic BP Percentile      Pulse Rate 66     Resp 18     Temp 98 F (36.7 C)     Temp Source Oral     SpO2 98 %     Weight 172 lb (78 kg)     Height 5' 6 (1.676 m)     Head Circumference      Peak Flow      Pain Score 8     Pain Loc      Pain Education      Exclude from Growth Chart     Most recent vital signs: Vitals:   08/28/24 1030  BP: 102/89  Pulse: 66  Resp: 18  Temp: 98 F (36.7 C)  SpO2: 98%     General: Awake, no distress.  Appears to be uncomfortable. CV:  Good peripheral perfusion.  Resp:  Normal effort.  Abd:  No distention.  Other:  To external hemorrhoids with 1 markedly thrombosed and extremely tender to light palpation.   ED Results / Procedures / Treatments   Labs (all labs ordered are listed, but only abnormal results are displayed) Labs Reviewed - No data to display    PROCEDURES:  Critical Care performed:   .Incision and Drainage  Date/Time: 08/28/2024 12:58 PM  Performed by: Saunders Shona CROME, PA-C Authorized by: Saunders Shona CROME, PA-C   Consent:    Consent obtained:  Verbal   Consent given by:  Patient   Risks discussed:  Bleeding, incomplete drainage and infection Universal  protocol:    Patient identity confirmed:  Verbally with patient Location:    Type:  External thrombosed hemorrhoid   Location:  Anogenital   Anogenital location:  Rectum Pre-procedure details:    Skin preparation:  Antiseptic wash Sedation:    Sedation type:  None Anesthesia:    Anesthesia method:  Topical application and local infiltration   Topical anesthetic:  EMLA cream   Local anesthetic:  Lidocaine 1% w/o epi Procedure type:    Complexity:  Simple Procedure details:    Incision types:  Stab incision   Incision depth:  Subcutaneous   Wound management:  Irrigated with saline and probed and deloculated   Wound treatment:  Wound left open   Packing materials:  None Post-procedure details:    Procedure completion:  Tolerated well, no immediate complications    MEDICATIONS ORDERED IN ED: Medications  lidocaine (PF) (XYLOCAINE) 1 % injection 5 mL (has no administration in time range)  oxyCODONE (Oxy IR/ROXICODONE) immediate release tablet 5 mg (5 mg  Oral Given 08/28/24 1219)  lidocaine-prilocaine (EMLA) cream ( Topical Given 08/28/24 1220)     IMPRESSION / MDM / ASSESSMENT AND PLAN / ED COURSE  I reviewed the triage vital signs and the nursing notes.   Differential diagnosis includes, but is not limited to, external hemorrhoid, fissure, thrombosed hemorrhoid.  62 year old female presents to the ED with complaint of external hemorrhoid that has become extremely painful.  Patient has been doing sitz bath's without improvement.  Exam shows a large thrombosed hemorrhoid.  This was I&D and patient was made aware that most likely she will continue to see blood clots with her sitz bath's.  We discussed follow-up with a surgeon for complete removal of her nonthrombosed hemorrhoid as well as if she continues to have problems with the thrombosed hemorrhoid.  Hydrocodone  was sent to the pharmacy along with a prescription for Colace to prevent constipation.      Patient's  presentation is most consistent with acute, uncomplicated illness.  FINAL CLINICAL IMPRESSION(S) / ED DIAGNOSES   Final diagnoses:  Thrombosed external hemorrhoid     Rx / DC Orders   ED Discharge Orders          Ordered    HYDROcodone -acetaminophen  (NORCO/VICODIN) 5-325 MG tablet  Every 6 hours PRN        08/28/24 1353    docusate sodium (COLACE) 100 MG capsule  2 times daily        08/28/24 1353             Note:  This document was prepared using Dragon voice recognition software and may include unintentional dictation errors.   Saunders Shona CROME, PA-C 08/28/24 1401    Dorothyann Drivers, MD 08/28/24 [phone]

## 2024-08-28 NOTE — ED Triage Notes (Signed)
 Patient to ED via POV for hemorrhoids. States she has had same for a while but pain much worse since yesterday. States recently had stomach bug but has been over that for a few days.
